# Patient Record
Sex: Female | Born: 1960
Health system: Southern US, Community
[De-identification: ages and names within clinical notes are randomized; demographics above are authoritative.]

## PROBLEM LIST (undated history)

## (undated) DIAGNOSIS — G4730 Sleep apnea, unspecified: Secondary | ICD-10-CM

## (undated) DIAGNOSIS — M1990 Unspecified osteoarthritis, unspecified site: Secondary | ICD-10-CM

## (undated) DIAGNOSIS — Z803 Family history of malignant neoplasm of breast: Secondary | ICD-10-CM

## (undated) HISTORY — PX: TUBAL LIGATION: SHX77

## (undated) HISTORY — DX: Family history of malignant neoplasm of breast: Z80.3

## (undated) HISTORY — PX: OTHER SURGICAL HISTORY: SHX169

---

## 1997-11-17 ENCOUNTER — Other Ambulatory Visit: Admission: RE | Admit: 1997-11-17 | Discharge: 1997-11-17 | Payer: Self-pay | Admitting: Obstetrics & Gynecology

## 1999-07-04 ENCOUNTER — Other Ambulatory Visit: Admission: RE | Admit: 1999-07-04 | Discharge: 1999-07-04 | Payer: Self-pay | Admitting: Obstetrics & Gynecology

## 2001-04-23 ENCOUNTER — Other Ambulatory Visit: Admission: RE | Admit: 2001-04-23 | Discharge: 2001-04-23 | Payer: Self-pay | Admitting: Obstetrics & Gynecology

## 2001-10-24 ENCOUNTER — Encounter: Payer: Self-pay | Admitting: Emergency Medicine

## 2001-10-24 ENCOUNTER — Emergency Department (HOSPITAL_COMMUNITY): Admission: EM | Admit: 2001-10-24 | Discharge: 2001-10-24 | Payer: Self-pay | Admitting: Emergency Medicine

## 2002-05-02 ENCOUNTER — Other Ambulatory Visit: Admission: RE | Admit: 2002-05-02 | Discharge: 2002-05-02 | Payer: Self-pay | Admitting: Obstetrics & Gynecology

## 2003-08-31 ENCOUNTER — Encounter: Admission: RE | Admit: 2003-08-31 | Discharge: 2003-08-31 | Payer: Self-pay | Admitting: Obstetrics & Gynecology

## 2003-09-29 ENCOUNTER — Other Ambulatory Visit: Admission: RE | Admit: 2003-09-29 | Discharge: 2003-09-29 | Payer: Self-pay | Admitting: Obstetrics & Gynecology

## 2004-06-08 HISTORY — PX: ABDOMINAL HYSTERECTOMY: SHX81

## 2004-11-18 ENCOUNTER — Ambulatory Visit: Payer: Self-pay | Admitting: Internal Medicine

## 2004-11-25 ENCOUNTER — Ambulatory Visit: Payer: Self-pay | Admitting: Internal Medicine

## 2005-01-02 ENCOUNTER — Ambulatory Visit: Payer: Self-pay | Admitting: Internal Medicine

## 2005-01-03 ENCOUNTER — Ambulatory Visit: Payer: Self-pay | Admitting: Family Medicine

## 2005-02-06 ENCOUNTER — Other Ambulatory Visit: Admission: RE | Admit: 2005-02-06 | Discharge: 2005-02-06 | Payer: Self-pay | Admitting: Obstetrics & Gynecology

## 2005-02-12 ENCOUNTER — Ambulatory Visit: Payer: Self-pay | Admitting: Internal Medicine

## 2005-02-14 ENCOUNTER — Encounter: Admission: RE | Admit: 2005-02-14 | Discharge: 2005-02-14 | Payer: Self-pay | Admitting: Obstetrics & Gynecology

## 2005-02-20 ENCOUNTER — Ambulatory Visit: Payer: Self-pay | Admitting: Internal Medicine

## 2005-04-04 ENCOUNTER — Ambulatory Visit: Payer: Self-pay | Admitting: Internal Medicine

## 2005-05-02 ENCOUNTER — Ambulatory Visit: Payer: Self-pay | Admitting: Internal Medicine

## 2005-06-23 ENCOUNTER — Ambulatory Visit (HOSPITAL_COMMUNITY): Admission: RE | Admit: 2005-06-23 | Discharge: 2005-06-23 | Payer: Self-pay | Admitting: Obstetrics & Gynecology

## 2005-07-17 ENCOUNTER — Inpatient Hospital Stay (HOSPITAL_COMMUNITY): Admission: RE | Admit: 2005-07-17 | Discharge: 2005-07-19 | Payer: Self-pay | Admitting: Obstetrics & Gynecology

## 2005-07-17 ENCOUNTER — Encounter (INDEPENDENT_AMBULATORY_CARE_PROVIDER_SITE_OTHER): Payer: Self-pay | Admitting: Specialist

## 2006-05-14 ENCOUNTER — Encounter: Admission: RE | Admit: 2006-05-14 | Discharge: 2006-05-14 | Payer: Self-pay | Admitting: Obstetrics & Gynecology

## 2008-12-07 LAB — CONVERTED CEMR LAB: Pap Smear: NORMAL

## 2009-02-16 ENCOUNTER — Ambulatory Visit: Payer: Self-pay | Admitting: Internal Medicine

## 2009-02-16 DIAGNOSIS — M949 Disorder of cartilage, unspecified: Secondary | ICD-10-CM

## 2009-02-16 DIAGNOSIS — Z780 Asymptomatic menopausal state: Secondary | ICD-10-CM | POA: Insufficient documentation

## 2009-02-16 DIAGNOSIS — M899 Disorder of bone, unspecified: Secondary | ICD-10-CM | POA: Insufficient documentation

## 2009-02-16 LAB — CONVERTED CEMR LAB
BUN: 13 mg/dL (ref 6–23)
Basophils Relative: 0.7 % (ref 0.0–3.0)
CO2: 31 meq/L (ref 19–32)
Cholesterol: 190 mg/dL (ref 0–200)
Direct LDL: 123.2 mg/dL
Eosinophils Relative: 1.4 % (ref 0.0–5.0)
HCT: 40.4 % (ref 36.0–46.0)
MCHC: 33.9 g/dL (ref 30.0–36.0)
MCV: 91.1 fL (ref 78.0–100.0)
Monocytes Absolute: 0.6 10*3/uL (ref 0.1–1.0)
Monocytes Relative: 8.1 % (ref 3.0–12.0)
Neutro Abs: 5.3 10*3/uL (ref 1.4–7.7)
RBC: 4.43 M/uL (ref 3.87–5.11)
Sodium: 142 meq/L (ref 135–145)

## 2009-08-02 ENCOUNTER — Encounter: Payer: Self-pay | Admitting: Internal Medicine

## 2010-07-26 NOTE — Discharge Summary (Signed)
Linda Mullins, Linda Mullins               ACCOUNT NO.:  [id number]   MEDICAL RECORD NO.:  [id number]          PATIENT TYPE:  INP   LOCATION:  9304                          FACILITY:  [facility]   PHYSICIAN:  Freddy Finner, M.D.   DATE OF BIRTH:  05-14-1960   DATE OF ADMISSION:  07/17/2005  DATE OF DISCHARGE:  07/19/2005                                 DISCHARGE SUMMARY   DISCHARGE DIAGNOSES:  1.  Uterine leiomyoma.  2.  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  3.  Cystourethrocele, with stress urinary incontinence.   OPERATIVE PROCEDURE:  1.  Total abdominal hysterectomy.  2.  Bilateral salpingo-oophorectomy.  3.  Marshall-Marchetti-Krantz bladder suspension.   INTRAOPERATIVE COMPLICATIONS:  None.   POSTOPERATIVE COMPLICATIONS:  None.   DISPOSITION:  The patient is voiding 250 mL with 100 mL of residual at the  time of her discharge.  For that reason, her catheter was removed.  Skin  staples were removed.  The patient was discharged home on a regular diet.  She is to resume any and all preoperative medications.  She is to use  Vivelle-Dot 0.1 mg for hormone replacement therapy.  She is given Percocet  to be taken 1-2 every 4 hours as needed for postoperative pain.  She is to  take ibuprofen along with this as needed.  She is to return to the office in  approximately two weeks.   Details of the present illness, past history, family history, review of  systems and physical exam are according to the admission note and/or the  operative summary.  Her physical findings on admission were primarily  remarkable for the pelvic findings with cystourethrocele with uterine  enlargement from fibroids and with a complex left adnexal mass on pelvic  ultrasound and CT scan.   Laboratory data during this admission includes a normal CBC on admission  with hemoglobin of 13.2.  Postoperatively, hemoglobin was 11.9, white count  15.7 on the first postoperative day.  The hemoglobin was 11.4 and  white  count 9.8 on the second postoperative day.   HOSPITAL COURSE:  The patient had the above-described procedure on the  morning of surgery.  She was treated perioperatively with antibiotic and  with PAS hose.  Over the next two days, she had adequate return of bladder  function.  She was afebrile throughout her post-op stay.  She was ambulating  without assistance.  She was tolerating a regular diet.  She was having  adequate bowel function.  She was discharged home with disposition as noted  above.      Freddy Finner, M.D.  Electronically Signed     WRN/MEDQ  D:  07/19/2005  T:  07/21/2005  Job:  [id number]

## 2010-07-26 NOTE — H&P (Signed)
NAMEARTEMISIA, Linda Mullins               ACCOUNT NO.:  [id number]   MEDICAL RECORD NO.:  [id number]          PATIENT TYPE:  AMB   LOCATION:  SDC                           FACILITY:  [facility]   PHYSICIAN:  Freddy Finner, M.D.   DATE OF BIRTH:  06-04-1960   DATE OF ADMISSION:  07/17/2005  DATE OF DISCHARGE:                                HISTORY & PHYSICAL   ADMISSION DIAGNOSES:  1.  Complex left ovarian cyst with cystic and solid components.  2.  Uterine leiomyomata.  3.  Menorrhagia.  4.  Recent increase in size of uterine leiomyomata.   The patient is a 50 year old white, married female, gravida 3, para 2, who  was seen for her regular annual examination in November 2006 at which time  it was felt that uterine leiomyomata first noted on examination in 1997 had  increased in size, and there was progressively increasing menorrhagia.  Her  exam was otherwise unremarkable for changes on her breast exam.  Because of  the menorrhagia, she was scheduled for sonohistogram which was actually not  accomplished in the office until April 12 of this year at which time  numerous leiomyomata were noted, but also present was a 3.1 x 3.5 cm cystic  mass with cystic and solid components including masses within the cyst  measuring 1.6 cm and 9 mm suggestive of dermoid or mucinous cyst adenoma.  Obviously the greatest concern at this point was the mass.  She subsequently  underwent further evaluation including serum CA-125 which was well within  the normal range.  She had a CT scan of the abdomen and pelvis which  confirmed uterine leiomyomata and the description of exophytic masses  arising from the interior aspect of the uterine fundus and also from the  anterior fundus.  In verbal consultation with Dr. Manson Passey, the radiologist, at  the [facility], she felt these findings were entirely consistent with  fibroids. She also noted the ovarian cyst and noted fat in the cyst.  No  other abnormalities were seen  on abdominal and pelvic CT scan.  The concern  about the cyst was discussed at great length with the patient on at least 2  different occasions.  Initially there was some concern about the presence of  malignancy which is not completely excluded, but given findings most  consistent with a dermoid cyst by CT and in combination with the ultrasound  and a negative CA-125 and otherwise normal pelvic and abdominal CT except  for fibroids, it was elected to proceed with total abdominal hysterectomy  and bilateral salpingo-oophorectomy.  This further addresses her additional  clinical issues with menorrhagia, and sonohistogram identified no  intracavitary abnormalities.   The patient had one additional complaint, and that is stress urinary  incontinence.  Preoperative evaluation and cystometric with a 3-channel  cystoscope in the office did in fact demonstrate stress urinary incontinence  with full bladder in standing position.  Other findings of note were a large  bladder capacity of approximately 750 mL __________ storage with 300 mL.  Her pelvic findings reveal minimal cystocele but significant  loss of  urethrovesical angle and increased mobility of the urethra.   Her current Review of Systems is otherwise negative.  There are no  cardiopulmonary, GI, or GU complaints.   PAST MEDICAL HISTORY:  The patient has no known significant medical  illnesses.  She has had mild depressive mood disorder for which she takes  Zoloft 50 to 100 mg a day.  Additional supplements which she takes are  Niacin, aspirin, and calcium.   The patient has been a relatively light cigarette smoker.  She uses alcohol  only socially.   She has no known allergies to medications.   Previous surgical procedures include therapeutic AB in 1985.  She had  laparoscopic sterilization in 1996.  At that time, she was noted to have  symmetrically, slightly enlarged uterus but no definite leiomyomata.  She  also has given  birth to 2 children vaginally.   FAMILY HISTORY:  Noncontributory.  There is a previous family history of  ovarian cancer.   PHYSICAL EXAMINATION:  HEENT:  Grossly within normal limits.  NECK:  Thyroid gland is not palpably enlarged.  VITAL SIGNS: Blood pressure in the office was 124/80.  CHEST: Clear to auscultation.  HEART: Normal sinus rhythm without murmurs, rubs, or gallops.  BREASTS:  Exam is remarkable for density in the upper outer quadrants  bilaterally.  (Mammogram in December 2006 did show question of masses, but  subsequent additional views of the breast revealed cystic changes only.)  ABDOMEN:  Soft and nontender without appreciable organomegaly or palpable  masses.  PELVIC:  External genitalia, vagina, and cervix are normal to inspection.  There is loss of urethrovesical angle with Valsalva noted above with  mobility of the urethra and at most first degree cystocele.  Posterior  support is good.  Uterus is palpably enlarged and irregularly nodular to  approximately 9 to [redacted] weeks gestation size.  The adnexal mass is not easily  palpable on clinical exam.  Rectovaginal exam confirms the above findings.  There is no palpable nodularity in the cul-de-sac or uterosacral ligaments.  Rectum is palpably normal.  EXTREMITIES:  Without cyanosis, clubbing, or edema.   ASSESSMENT:  1.  Complex left adnexal mass, most like benign cystic teratoma.  2.  Uterine leiomyomata, multiple, with secondary clinical symptoms of      menorrhagia.  3.  Stress urinary incontinence with cystometric findings consistent with      pretty much pure stress incontinence.   PLAN:  1.  Total abdominal hysterectomy.  2.  Bilateral salpingo-oophorectomy.  3.  Marshall-Marchetti-Krantz versus possible Burch suspension of bladder      and urethra.   All of the procedures have been discussed at length with the patient including a video which she viewed in the office describing hysterectomy.  The  potential risks of the procedure have been discussed.      Freddy Finner, M.D.  Electronically Signed     WRN/MEDQ  D:  07/16/2005  T:  07/16/2005  Job:  [id number]

## 2010-07-26 NOTE — Op Note (Signed)
Linda Mullins, Linda Mullins               ACCOUNT NO.:  [id number]   MEDICAL RECORD NO.:  [id number]          PATIENT TYPE:  INP   LOCATION:  9304                          FACILITY:  [facility]   PHYSICIAN:  Freddy Finner, M.D.   DATE OF BIRTH:  1960/09/28   DATE OF PROCEDURE:  07/17/2005  DATE OF DISCHARGE:                                 OPERATIVE REPORT   PREOPERATIVE DIAGNOSES:  1.  Uterine leiomyomata.  2.  Complex left ovarian mass.   POSTOPERATIVE DIAGNOSES:  1.  Mature cystic teratoma of left ovary.  2.  Uterine fibroids.   OPERATIVE PROCEDURE:  1.  Total abdominal hysterectomy.  2.  Bilateral salpingo-oophorectomy.   SECONDARY DIAGNOSIS:  Pure stress urinary incontinence by cystometric  evaluation.   SECONDARY PROCEDURE:  Marshall-Marchetti-Krantz suspension of bladder.   SURGEON:  Dr. Jennette Kettle   ASSISTANT:  Renaldo Fiddler   ESTIMATED INTRAOPERATIVE BLOOD LOSS:  300 mL.   INTRAOPERATIVE COMPLICATIONS:  None.   Details of the present illness recorded in the admission note.  The patient  was admitted on the morning of surgery.  She was given an IV antibiotic  preoperatively.  She was placed in antiembolic PAS hose.  She was brought to  the operating room, placed under adequate general anesthesia, placed in the  dorsal recumbent position with the Allen stirrup system to support the legs  and expose the vulva for the operative procedure.  The abdomen, perineum,  and vagina were prepped in the usual fashion with Betadine scrub followed by  Betadine solution.  A 30 mL bulbed Foley with a triple lumen was placed  without difficulty.  Sterile drapes were applied.  A lower abdominal  transverse incision was made approximately 3 cm above the symphysis.  The  incision was carried sharply down the fascia which was entered sharply and  extended to the extent of the skin incision.  Subcutaneous and fascial  vessels were controlled with a Bovie.  Rectus sheath was developed  superiorly and  inferiorly with blunt and sharp dissection.  Rectus muscles  were divided in the midline.  Peritoneum was elevated and entered sharply  and extended sharply to the extent of skin incision.  Peritoneal washings  were taken but later discarded because of the benign finding on frozen  section.  Aspiration of the upper abdomen was carried out with normal liver  edge, no palpable abnormality of gallbladder or kidneys.  The appendix was  visualized and was normal.  A self-retaining O'Connor-O'Sullivan retractor  was placed.  Moist packs were used to pack the distal contents out of the  pelvis.  The uterus was irregularly nodular and enlarged to approximately 10  weeks' gestational size.  The left ovary was enlarged to approximately 5 cm,  was smooth externally and free of any adhesions.  The right fallopian tube  and ovary were normal.  The left fallopian tube was normal.  The patient had  had previous tubal ligation.  There was isthmic portion of the tube missing  on either side.  The proximal broad ligaments were grasped with Kellys.  The  round  ligaments on each side were taken with a suture ligature of 0 Monocryl  and divided.  The ovary, in fact, was excised and sent before beginning the  hysterectomy portion of the procedure.  The left infundibulopelvic ligament  was divided and doubly ligated with free tie of 0 Monocryl followed by  suture ligature of 0 Monocryl.  The right infundibulopelvic ligament was  developed in a similar fashion.  The upper broad ligament was then developed  using the gyrus tripolar Heaney-style clamp.  The bladder was released with  a transverse incision in the peritoneum overlying the lower uterine segment  and cervix.  Bladder was very carefully dissected with blunt and sharp  dissection off the cervix using the gyrus device.  The uteroovarian pedicles  were sealed and divided.  The cardinal ligament pedicles were then sealed  and divided.  The uterosacrals were  taken with curved Heaneys, divided  sharply, and ligated with 0 Monocryl in a Heaney fashion.  Angles of the  vagina were then entered using a Heaney and the cervix completely excised  from the vaginal cuff.  The uterosacral pedicle actually included the angle  on the left.  On the right, the angle was secured and anchored to the  uterosacral pedicle.  Cuff was closed from anterior to posterior.  Small  bleeding sources along the cuff were controlled with the Bovie, and a single  interrupted suture was placed anteriorly on the cuff for complete  hemostasis.  Irrigation was carried out.  Small bleeding sources were  controlled with Bovie along the left pelvic sidewall.  Hemostasis was noted  then to be complete.  All pack, needle, and instruments were removed.  The  peritoneum was then closed with a running 0 Monocryl suture.  The space of  Retzius was carefully dissected with blunt dissection.  A 0 Dexon suture on  a UR-6 needle was then used and with digital palpation vaginally, the  urethrovesical angle was identified while traction was applied to the Foley  bulb.  A suture was placed approximately 1 cm lateral to the urethra and 1  cm inferior to the bladder on the right side and anchored to the proximal  portion of Cooper's ligament.  On the left side, the suture was placed  similarly with the same technique and the suture anchored to the periosteum  of the symphysis.  Sutures were tied which adequately elevated the  urethrovesical angle.  Bladder was then filled with irrigating solution and  a Bonanno catheter placed and anchored to the skin with nylon sutures.  Irrigation was carried out.  Fascia was then closed with a running 0 PDS  running from angle to midline on either side.  Skin was closed with wide  skin staples and quarter-inch Steri-Strips.  The patient tolerated the  operative procedure well.  She was awakened and taken to the recovery room  in good condition.      Freddy Finner, M.D.  Electronically Signed     WRN/MEDQ  D:  07/17/2005  T:  07/17/2005  Job:  [id number]

## 2010-10-17 ENCOUNTER — Other Ambulatory Visit: Payer: Self-pay | Admitting: Obstetrics & Gynecology

## 2010-10-17 DIAGNOSIS — R928 Other abnormal and inconclusive findings on diagnostic imaging of breast: Secondary | ICD-10-CM

## 2010-10-25 ENCOUNTER — Ambulatory Visit
Admission: RE | Admit: 2010-10-25 | Discharge: 2010-10-25 | Disposition: A | Discharge status: Home / Self Care | Payer: Self-pay | Source: Ambulatory Visit | Attending: Obstetrics & Gynecology | Admitting: Obstetrics & Gynecology

## 2010-10-25 ENCOUNTER — Other Ambulatory Visit: Payer: Self-pay | Admitting: Obstetrics & Gynecology

## 2010-10-25 DIAGNOSIS — R928 Other abnormal and inconclusive findings on diagnostic imaging of breast: Secondary | ICD-10-CM

## 2010-10-25 DIAGNOSIS — R921 Mammographic calcification found on diagnostic imaging of breast: Secondary | ICD-10-CM

## 2010-11-01 ENCOUNTER — Ambulatory Visit
Admission: RE | Admit: 2010-11-01 | Discharge: 2010-11-01 | Disposition: A | Discharge status: Home / Self Care | Payer: Self-pay | Source: Ambulatory Visit | Attending: Obstetrics & Gynecology | Admitting: Obstetrics & Gynecology

## 2010-11-01 ENCOUNTER — Other Ambulatory Visit: Payer: Self-pay | Admitting: Diagnostic Radiology

## 2010-11-01 DIAGNOSIS — R921 Mammographic calcification found on diagnostic imaging of breast: Secondary | ICD-10-CM

## 2010-11-14 ENCOUNTER — Encounter (INDEPENDENT_AMBULATORY_CARE_PROVIDER_SITE_OTHER): Payer: Self-pay | Admitting: Surgery

## 2010-11-14 ENCOUNTER — Ambulatory Visit (INDEPENDENT_AMBULATORY_CARE_PROVIDER_SITE_OTHER): Payer: Self-pay | Admitting: Surgery

## 2010-11-14 VITALS — BP 122/82 | HR 68 | Temp 96.8°F | Ht 67.0 in | Wt 164.0 lb

## 2010-11-14 DIAGNOSIS — N6029 Fibroadenosis of unspecified breast: Secondary | ICD-10-CM

## 2010-11-14 NOTE — Progress Notes (Signed)
ASSESSMENT AND PLAN: 1.  Radial sclerosing lesion, Left UOQ  Despite benign report, would recommend excision of this area.  Risks of surgery include, but are not limited to, bleeding, infection, nerve injury and need for further excision.  I gave her a copy of her path report and literature on breast surgery.  Chief Complaint  Patient presents with  . Other    Eval of left breast radial sclerosing lesion - Dr. Fredrich Romans [phone]/[employer] [facility]    HISTORY OF PRESENT ILLNESS: Linda Mullins is a 50 y.o. (DOB: 1961-02-08)  white female who is a patient of Carrie Mew, MD and comes to me today for left breast lesion.  She sees Dr. Renato Shin from a GYN standpoint.  The patient's mother two months ago underwent a biopsy for a sclerosing lesion.  Ms. Fite gets annual mammograms and has never had any prior biopsy.  She had a mammogram 10/25/2010 which showed a suspicious area in the left breast.  A core biopsy 11/01/2010 showed a radial sclerosing lesion. She had a BSO-TAH in 2005 for benign disease.  She is on the hormone patch.  She has an aunt on her father's side who had breast cancer.  She has had some cyst in her right breast before, but these were never biopsied or aspirate.  Past Medical History  Diagnosis Date  . Family history of breast cancer     aunt on father's side    Past Surgical History  Procedure Date  . Abdominal hysterectomy 06/2004    Current Outpatient Prescriptions  Medication Sig Dispense Refill  . calcium carbonate (OS-CAL) 600 MG TABS Take 600 mg by mouth daily.        . Cholecalciferol (VITAMIN D) 2000 UNITS CAPS Take by mouth daily.        Marland Kitchen estradiol (VIVELLE-DOT) 0.1 MG/24HR Place 1 patch onto the skin 2 (two) times a week.        . Multiple Vitamin (MULTIVITAMIN) capsule Take 1 capsule by mouth daily.         REVIEW OF SYSTEMS: Skin:  No history of rash.  No history of abnormal moles. Infection: .  No history of MRSA. Neurologic:  No history of  stroke.  No history of seizure.  No history of headaches. Cardiac:  No history of hypertension. No history of heart disease.  No history of prior cardiac catheterization.  No history of seeing a cardiologist. Pulmonary:  Does not smoke cigarettes.  No asthma or bronchitis.  No OSA/CPAP.  Endocrine:  No diabetes. No thyroid disease. Gastrointestinal:  No history of stomach disease.  No history of liver disease.  No history of gall bladder disease.  No history of pancreas disease.  No history of colon disease. Urologic:  No history of kidney stones.  No history of bladder infections. Musculoskeletal:  No history of joint or back disease. Hematologic:  No bleeding disorder.  No history of anemia.  Not anticoagulated.  No Known Allergies  SOCIAL and FAMILY HISTORY: Married.  Works as a Sales executive.  Knows [place] who go to her dentist.  PHYSICAL EXAM: BP 122/82  Pulse 68  Temp(Src) 96.8 F (36 C) (Temporal)  Ht 5\' 7"  (1.702 m)  Wt 164 lb (74.39 kg)  BMI 25.69 kg/m2  HEENT: Normal. Pupils equal. Normal dentition. Neck: Supple. No thyroid mass. Lymph Nodes:  No supraclavicular or cervical nodes.  No axillary adenopathy. Lungs: Clear and symmetric. Heart:  RRR. No murmur.  Breasts:  Left breast  with nodule at biopsy site (3 o'clock).  This came up after the biopsy.  Right breast unremarkable. Abdomen: No mass. No tenderness. No hernia. Normal bowel sounds.  No abdominal scars. Rectal: Not done. Extremities:  Good strength in upper and lower extremities. Neurologic:  Grossly intact to motor and sensory function.  DATA REVIEWED: Path report - 11/01/2010. Discussed with Dr. Irine Seal about options.

## 2010-11-26 ENCOUNTER — Other Ambulatory Visit (INDEPENDENT_AMBULATORY_CARE_PROVIDER_SITE_OTHER): Payer: Self-pay | Admitting: Surgery

## 2010-11-26 DIAGNOSIS — N649 Disorder of breast, unspecified: Secondary | ICD-10-CM

## 2011-01-22 ENCOUNTER — Encounter (HOSPITAL_BASED_OUTPATIENT_CLINIC_OR_DEPARTMENT_OTHER): Payer: Self-pay | Admitting: *Deleted

## 2011-01-24 ENCOUNTER — Ambulatory Visit (HOSPITAL_BASED_OUTPATIENT_CLINIC_OR_DEPARTMENT_OTHER): Payer: PRIVATE HEALTH INSURANCE | Admitting: Anesthesiology

## 2011-01-24 ENCOUNTER — Encounter (HOSPITAL_BASED_OUTPATIENT_CLINIC_OR_DEPARTMENT_OTHER): Payer: Self-pay | Admitting: Surgery

## 2011-01-24 ENCOUNTER — Other Ambulatory Visit: Payer: Self-pay

## 2011-01-24 ENCOUNTER — Encounter (HOSPITAL_BASED_OUTPATIENT_CLINIC_OR_DEPARTMENT_OTHER): Payer: Self-pay | Admitting: *Deleted

## 2011-01-24 ENCOUNTER — Encounter (HOSPITAL_BASED_OUTPATIENT_CLINIC_OR_DEPARTMENT_OTHER): Payer: Self-pay | Admitting: Anesthesiology

## 2011-01-24 ENCOUNTER — Ambulatory Visit (HOSPITAL_BASED_OUTPATIENT_CLINIC_OR_DEPARTMENT_OTHER)
Admission: RE | Admit: 2011-01-24 | Discharge: 2011-01-24 | Disposition: A | Discharge status: Home / Self Care | Payer: PRIVATE HEALTH INSURANCE | Source: Ambulatory Visit | Attending: Surgery | Admitting: Surgery

## 2011-01-24 ENCOUNTER — Ambulatory Visit
Admission: RE | Admit: 2011-01-24 | Discharge: 2011-01-24 | Disposition: A | Discharge status: Home / Self Care | Payer: Self-pay | Source: Ambulatory Visit | Attending: Surgery | Admitting: Surgery

## 2011-01-24 ENCOUNTER — Other Ambulatory Visit (INDEPENDENT_AMBULATORY_CARE_PROVIDER_SITE_OTHER): Payer: Self-pay | Admitting: Surgery

## 2011-01-24 ENCOUNTER — Encounter (HOSPITAL_BASED_OUTPATIENT_CLINIC_OR_DEPARTMENT_OTHER)
Admission: RE | Disposition: A | Discharge status: Home / Self Care | Payer: Self-pay | Source: Ambulatory Visit | Attending: Surgery

## 2011-01-24 DIAGNOSIS — D249 Benign neoplasm of unspecified breast: Secondary | ICD-10-CM

## 2011-01-24 DIAGNOSIS — N6029 Fibroadenosis of unspecified breast: Secondary | ICD-10-CM | POA: Insufficient documentation

## 2011-01-24 DIAGNOSIS — Z01812 Encounter for preprocedural laboratory examination: Secondary | ICD-10-CM | POA: Insufficient documentation

## 2011-01-24 DIAGNOSIS — N649 Disorder of breast, unspecified: Secondary | ICD-10-CM

## 2011-01-24 HISTORY — DX: Unspecified osteoarthritis, unspecified site: M19.90

## 2011-01-24 HISTORY — PX: BREAST BIOPSY: SHX20

## 2011-01-24 SURGERY — BREAST BIOPSY
Anesthesia: General | Laterality: Left

## 2011-01-24 SURGERY — BREAST BIOPSY WITH NEEDLE LOCALIZATION
Anesthesia: General | Site: Breast | Laterality: Left | Wound class: Clean

## 2011-01-24 MED ORDER — HYDROCODONE-ACETAMINOPHEN 5-325 MG PO TABS: 1.0000 | ORAL_TABLET | Freq: Four times a day (QID) | ORAL | Status: AC | PRN

## 2011-01-24 MED ORDER — FENTANYL CITRATE 0.05 MG/ML IJ SOLN
INTRAMUSCULAR | Status: DC | PRN
Start: 2011-01-24 — End: 2011-01-24
  Administered 2011-01-24: 100 ug via INTRAVENOUS
  Administered 2011-01-24 (×2): 50 ug via INTRAVENOUS

## 2011-01-24 MED ORDER — MIDAZOLAM HCL 5 MG/5ML IJ SOLN
INTRAMUSCULAR | Status: DC | PRN
  Administered 2011-01-24: 2 mg via INTRAVENOUS

## 2011-01-24 MED ORDER — LIDOCAINE HCL (CARDIAC) 20 MG/ML IV SOLN
INTRAVENOUS | Status: DC | PRN
  Administered 2011-01-24: 40 mg via INTRAVENOUS

## 2011-01-24 MED ORDER — MEPERIDINE HCL 25 MG/ML IJ SOLN: 6.2500 mg | INTRAMUSCULAR | Status: DC | PRN

## 2011-01-24 MED ORDER — BUPIVACAINE HCL (PF) 0.25 % IJ SOLN
INTRAMUSCULAR | Status: DC | PRN
  Administered 2011-01-24: 30 mL

## 2011-01-24 MED ORDER — ONDANSETRON HCL 4 MG/2ML IJ SOLN: 4.0000 mg | Freq: Once | INTRAMUSCULAR | Status: DC | PRN

## 2011-01-24 MED ORDER — PROPOFOL 10 MG/ML IV EMUL
INTRAVENOUS | Status: DC | PRN
  Administered 2011-01-24: 150 mg via INTRAVENOUS
  Administered 2011-01-24: 20 mg via INTRAVENOUS

## 2011-01-24 MED ORDER — LACTATED RINGERS IV SOLN
INTRAVENOUS | Status: DC
  Administered 2011-01-24: 20 mL/h via INTRAVENOUS
  Administered 2011-01-24: 14:00:00 via INTRAVENOUS

## 2011-01-24 MED ORDER — ONDANSETRON HCL 4 MG/2ML IJ SOLN
INTRAMUSCULAR | Status: DC | PRN
  Administered 2011-01-24: 4 mg via INTRAVENOUS

## 2011-01-24 MED ORDER — DEXAMETHASONE SODIUM PHOSPHATE 4 MG/ML IJ SOLN
INTRAMUSCULAR | Status: DC | PRN
  Administered 2011-01-24: 10 mg via INTRAVENOUS

## 2011-01-24 MED ORDER — HYDROMORPHONE HCL PF 1 MG/ML IJ SOLN
0.2500 mg | INTRAMUSCULAR | Status: DC | PRN
  Administered 2011-01-24 (×2): 0.5 mg via INTRAVENOUS

## 2011-01-24 SURGICAL SUPPLY — 45 items
BANDAGE ELASTIC 6 VELCRO ST LF (GAUZE/BANDAGES/DRESSINGS) IMPLANT
BENZOIN TINCTURE PRP APPL 2/3 (GAUZE/BANDAGES/DRESSINGS) IMPLANT
BINDER BREAST LRG (GAUZE/BANDAGES/DRESSINGS) IMPLANT
BINDER BREAST MEDIUM (GAUZE/BANDAGES/DRESSINGS) IMPLANT
BINDER BREAST XLRG (GAUZE/BANDAGES/DRESSINGS) ×2 IMPLANT
BINDER BREAST XXLRG (GAUZE/BANDAGES/DRESSINGS) IMPLANT
BLADE SURG 10 STRL SS (BLADE) ×2 IMPLANT
BLADE SURG 15 STRL LF DISP TIS (BLADE) IMPLANT
BLADE SURG 15 STRL SS (BLADE)
CANISTER SUCTION 1200CC (MISCELLANEOUS) ×2 IMPLANT
CHLORAPREP W/TINT 26ML (MISCELLANEOUS) ×2 IMPLANT
CLIP TI WIDE RED SMALL 6 (CLIP) IMPLANT
CLOTH BEACON ORANGE TIMEOUT ST (SAFETY) ×2 IMPLANT
COVER MAYO STAND STRL (DRAPES) ×2 IMPLANT
COVER TABLE BACK 60X90 (DRAPES) ×2 IMPLANT
DECANTER SPIKE VIAL GLASS SM (MISCELLANEOUS) IMPLANT
DERMABOND ADVANCED (GAUZE/BANDAGES/DRESSINGS) ×1
DERMABOND ADVANCED .7 DNX12 (GAUZE/BANDAGES/DRESSINGS) ×1 IMPLANT
DEVICE DUBIN W/COMP PLATE 8390 (MISCELLANEOUS) ×2 IMPLANT
DRAPE PED LAPAROTOMY (DRAPES) ×2 IMPLANT
DRAPE UTILITY XL STRL (DRAPES) ×2 IMPLANT
ELECT COATED BLADE 2.86 ST (ELECTRODE) ×2 IMPLANT
ELECT REM PT RETURN 9FT ADLT (ELECTROSURGICAL) ×2
ELECTRODE REM PT RTRN 9FT ADLT (ELECTROSURGICAL) ×1 IMPLANT
GAUZE SPONGE 4X4 12PLY STRL LF (GAUZE/BANDAGES/DRESSINGS) IMPLANT
GAUZE SPONGE 4X4 16PLY XRAY LF (GAUZE/BANDAGES/DRESSINGS) IMPLANT
GLOVE SURG SIGNA 7.5 PF LTX (GLOVE) ×4 IMPLANT
GOWN PREVENTION PLUS XLARGE (GOWN DISPOSABLE) ×4 IMPLANT
KIT MARKER MARGIN INK (KITS) ×2 IMPLANT
NDL SAFETY ECLIPSE 18X1.5 (NEEDLE) IMPLANT
NEEDLE HYPO 18GX1.5 SHARP (NEEDLE)
NEEDLE HYPO 25X1 1.5 SAFETY (NEEDLE) ×2 IMPLANT
NS IRRIG 1000ML POUR BTL (IV SOLUTION) ×2 IMPLANT
PACK BASIN DAY SURGERY FS (CUSTOM PROCEDURE TRAY) ×2 IMPLANT
PENCIL BUTTON HOLSTER BLD 10FT (ELECTRODE) ×2 IMPLANT
SLEEVE SCD COMPRESS KNEE MED (MISCELLANEOUS) ×2 IMPLANT
SPONGE LAP 4X18 X RAY DECT (DISPOSABLE) ×4 IMPLANT
STRIP CLOSURE SKIN 1/4X4 (GAUZE/BANDAGES/DRESSINGS) IMPLANT
SUT VIC AB 5-0 PS2 18 (SUTURE) ×2 IMPLANT
SUT VICRYL 3-0 CR8 SH (SUTURE) ×2 IMPLANT
SYR CONTROL 10ML LL (SYRINGE) ×2 IMPLANT
TOWEL OR NON WOVEN STRL DISP B (DISPOSABLE) ×2 IMPLANT
TUBE CONNECTING 20X1/4 (TUBING) ×2 IMPLANT
WATER STERILE IRR 1000ML POUR (IV SOLUTION) IMPLANT
YANKAUER SUCT BULB TIP NO VENT (SUCTIONS) ×2 IMPLANT

## 2011-01-24 NOTE — Transfer of Care (Signed)
Immediate Anesthesia Transfer of Care Note  Patient: Linda Mullins  Procedure(s) Performed:  BREAST BIOPSY WITH NEEDLE LOCALIZATION - LEFT BREAST NEEDLE LOCILIZED BIOPSY BREAST   Patient Location: PACU  Anesthesia Type: General  Level of Consciousness: awake, oriented and patient cooperative  Airway & Oxygen Therapy: Patient Spontanous Breathing and Patient connected to face mask oxygen  Post-op Assessment: Report given to PACU RN, Post -op Vital signs reviewed and stable and Patient moving all extremities  Post vital signs: Reviewed and stable  Complications: No apparent anesthesia complications

## 2011-01-24 NOTE — Anesthesia Postprocedure Evaluation (Signed)
  Anesthesia Post-op Note  Patient: Linda Mullins  Procedure(s) Performed:  BREAST BIOPSY WITH NEEDLE LOCALIZATION - LEFT BREAST NEEDLE LOCILIZED BIOPSY BREAST   Patient Location: PACU  Anesthesia Type: General  Level of Consciousness: awake  Airway and Oxygen Therapy: Patient Spontanous Breathing  Post-op Pain: none  Post-op Assessment: Post-op Vital signs reviewed  Post-op Vital Signs: stable  Complications: No apparent anesthesia complications

## 2011-01-24 NOTE — Op Note (Signed)
NAME:  DANISSA, RUNDLE                    ACCOUNT NO.:  MEDICAL RECORD NO.:  [id number]  LOCATION:                                 FACILITY:  PHYSICIAN:  Sandria Bales. Ezzard Standing, M.D.  DATE OF BIRTH:  1960-05-07  DATE OF PROCEDURE:  01/24/2011                              OPERATIVE REPORT  PREOPERATIVE DIAGNOSIS:  Radial sclerosing lesion, left breast at 3 o'clock position.  POSTOPERATIVE DIAGNOSIS:  Radial sclerosing lesion, left breast at 3 o'clock position.  PROCEDURE:  Left breast biopsy with needle localization.  SURGEON:  Sandria Bales. Ezzard Standing, MD  FIRST ASSISTANT:  None.  ANESTHESIA:  General endotracheal with approximately 30 mL of 0.25% Marcaine.  COMPLICATIONS:  None.  INDICATION FOR PROCEDURE:  Ms. Kepple is a 50 year old white female, patient of Dr. Darryll Capers who had an abnormal mammogram and underwent a core biopsy of her left brest, which showed a sclerosing lesion.  It was felt best that the patient have an excisional biopsy of this area to make sure there is no suspicious changes.  The indication and potential complication of breast biopsy were also explained to the patient.  Potential complications of the breast biopsy include, but are limited to, bleeding, infection, nerve injury, and the need for further surgery.  OPERATIVE NOTE:  The patient had a wire placed in her left breast coming at the 3 o'clock position, and angled a bit cranial.  She was taken to operating room 3 where she underwent a general endotracheal anesthetic. Her left breast was prepped with ChloraPrep and sterilely draped.    A time-out was held and surgical checklist was run.  I made an elliptical incision around the wire, which was about 3.5 to 4 cm in length.  I excised a core of breast tissue about 5 x 4 cm.  I did a specimen mammogram in the room, which confirmed the clip and the wire in the middle of the specimen.  The wound was then irrigated with saline, infiltrated with about  0.25% Marcaine plain.  The area was excised, specimen mammogram was shot.  I did paint the specimen oriented prior to the specimen mammogram with a 6 paint kit.  I again confirmed the clip and the wire in the middle of the specimen and sent to Pathology.  The wound was then irrigated with saline, infiltrated pressure wound with 30 mL of 0.25% Marcaine, closed with 3-0 Vicryl sutures in deep superficial layer and 5-0 Vicryl suture in the skin.  Wound was painted with Dermabond and sterilely dressed and a pressure dressing placed on it.  She tolerated the procedure well, was transported to the recovery room in good condition.   Sandria Bales. Ezzard Standing, M.D., FACS  DHN/MEDQ  D:  01/24/2011  T:  01/24/2011  Job:  [id number]  cc:   Stacie Glaze, MD Varney Baas, MD

## 2011-01-24 NOTE — Anesthesia Procedure Notes (Signed)
Procedure Name: LMA Insertion Date/Time: 01/24/2011 1:29 PM Performed by: Meyer Russel Pre-anesthesia Checklist: Patient identified, Emergency Drugs available, Suction available, Patient being monitored and Timeout performed Patient Re-evaluated:Patient Re-evaluated prior to inductionOxygen Delivery Method: Circle System Utilized Preoxygenation: Pre-oxygenation with 100% oxygen Intubation Type: IV induction Ventilation: Mask ventilation without difficulty LMA: LMA flexible inserted LMA Size: 4.0 Number of attempts: 1 Airway Equipment and Method: bite block Placement Confirmation: positive ETCO2 and breath sounds checked- equal and bilateral Tube secured with: Tape Dental Injury: Teeth and Oropharynx as per pre-operative assessment

## 2011-01-24 NOTE — Brief Op Note (Signed)
01/24/2011  2:23 PM  PATIENT:  Linda Mullins, 50 y.o., female, MRN: [id number]  PREOP DIAGNOSIS:  Sclerosing Left Breast Lesion  POSTOP DIAGNOSIS:   Radial sclerosing lesion,  Left breast at 3 o'clock  PROCEDURE:   Procedure(s): BREAST BIOPSY WITH NEEDLE LOCALIZATION  SURGEON:   Ovidio Kin, M.D.  ASSISTANT:   none  ANESTHESIA:   general  EBL:  min  Total I/O In: 1500 [I.V.:1500] Out: -   BLOOD ADMINISTERED: none  DRAINS: none   LOCAL MEDICATIONS USED:   30 cc 1/4% marcaine  SPECIMEN:   Breast biopsy  COUNTS CORRECT:  YES  INDICATIONS FOR PROCEDURE:  Linda Mullins is a 50 y.o. (DOB: Dec 12, 1960) white female whose primary care physician is Carrie Mew, MD and comes for left breast biopsy for sclerosing lesion.   The indications and risks of the surgery were explained to the patient.  The risks include, but are not limited to, infection, bleeding, and nerve injury.  Note dictated to:   [id number]  01/24/2011  Dn

## 2011-01-24 NOTE — H&P (Signed)
ASSESSMENT AND PLAN:  1. Radial sclerosing lesion, Left UOQ   Despite benign report, would recommend excision of this area.   Risks of surgery include, but are not limited to, bleeding, infection, nerve injury and need for further excision. I gave her a copy of her path report and literature on breast surgery.   Husband at bedside, no new information or questions.  For left breast biopsy 01/24/2011.  Chief Complaint   Patient presents with   .  Other     Eval of left breast radial sclerosing lesion - Dr. Fredrich Romans [phone]/[facility]    HISTORY OF PRESENT ILLNESS:  Linda Mullins is a 50 y.o. (DOB: 1961-01-04) white female who is a patient of Carrie Mew, MD and comes to me today for left breast lesion. She sees Dr. Renato Shin from a GYN standpoint.  The patient's mother two months ago underwent a biopsy for a sclerosing lesion. Ms. Hinesley gets annual mammograms and has never had any prior biopsy. She had a mammogram 10/25/2010 which showed a suspicious area in the left breast. A core biopsy 11/01/2010 showed a radial sclerosing lesion. She had a BSO-TAH in 2005 for benign disease. She is on the hormone patch. She has an aunt on her father's side who had breast cancer. She has had some cyst in her right breast before, but these were never biopsied or aspirate.   Past Medical History   Diagnosis  Date   .  Family history of breast cancer      aunt on father's side    Past Surgical History   Procedure  Date   .  Abdominal hysterectomy  06/2004    Current Outpatient Prescriptions   Medication  Sig  Dispense  Refill   .  calcium carbonate (OS-CAL) 600 MG TABS  Take 600 mg by mouth daily.     .  Cholecalciferol (VITAMIN D) 2000 UNITS CAPS  Take by mouth daily.     Marland Kitchen  estradiol (VIVELLE-DOT) 0.1 MG/24HR  Place 1 patch onto the skin 2 (two) times a week.     .   Multiple Vitamin (MULTIVITAMIN) capsule  Take 1 capsule by mouth daily.      REVIEW OF SYSTEMS:  Skin: No  history of rash. No history of abnormal moles.  Infection: . No history of MRSA.  Neurologic: No history of stroke. No history of seizure. No history of headaches.  Cardiac: No history of hypertension. No history of heart disease. No history of prior cardiac catheterization. No history of seeing a cardiologist.  Pulmonary: Does not smoke cigarettes. No asthma or bronchitis. No OSA/CPAP.  Endocrine: No diabetes. No thyroid disease.  Gastrointestinal: No history of stomach disease. No history of liver disease. No history of gall bladder disease. No history of pancreas disease. No history of colon disease.  Urologic: No history of kidney stones. No history of bladder infections.  Musculoskeletal: No history of joint or back disease.  Hematologic: No bleeding disorder. No history of anemia. Not anticoagulated.  No Known Allergies  SOCIAL and FAMILY HISTORY:  Married. Works as a Sales executive. Knows [place] who go to her dentist.   PHYSICAL EXAM:   HEENT: Normal. Pupils equal. Normal dentition.  Neck: Supple. No thyroid mass.  Lymph Nodes: No supraclavicular or cervical nodes. No axillary adenopathy.  Lungs: Clear and symmetric.  Heart: RRR. No murmur.  Breasts: Left breast with nodule at biopsy site (3 o'clock). This came up after the biopsy. Right breast  unremarkable.   Now has wire in left breast. Abdomen: No mass. No tenderness. No hernia. Normal bowel sounds. No abdominal scars.  Rectal: Not done.  Extremities: Good strength in upper and lower extremities.  Neurologic: Grossly intact to motor and sensory function.   DATA REVIEWED:  Path report - 11/01/2010.  Discussed with Dr. Irine Seal about options.

## 2011-01-24 NOTE — Anesthesia Preprocedure Evaluation (Addendum)
Anesthesia Evaluation  Patient identified by MRN, date of birth, ID band Patient awake    Reviewed: Allergy & Precautions, H&P , NPO status , Patient's Chart, lab work & pertinent test results  Airway       Dental   Pulmonary          Cardiovascular     Neuro/Psych    GI/Hepatic   Endo/Other    Renal/GU      Musculoskeletal   Abdominal   Peds  Hematology   Anesthesia Other Findings   Reproductive/Obstetrics                           Anesthesia Physical Anesthesia Plan  ASA: II  Anesthesia Plan: General   Post-op Pain Management:    Induction:   Airway Management Planned:   Additional Equipment:   Intra-op Plan:   Post-operative Plan:   Informed Consent: I have reviewed the patients History and Physical, chart, labs and discussed the procedure including the risks, benefits and alternatives for the proposed anesthesia with the patient or authorized representative who has indicated his/her understanding and acceptance.   Dental Advisory Given  Plan Discussed with: CRNA and Surgeon  Anesthesia Plan Comments:        Anesthesia Quick Evaluation  

## 2011-01-28 ENCOUNTER — Encounter (HOSPITAL_BASED_OUTPATIENT_CLINIC_OR_DEPARTMENT_OTHER): Payer: Self-pay | Admitting: Anesthesiology

## 2011-01-28 ENCOUNTER — Encounter (HOSPITAL_BASED_OUTPATIENT_CLINIC_OR_DEPARTMENT_OTHER): Payer: Self-pay | Admitting: Surgery

## 2011-02-13 ENCOUNTER — Ambulatory Visit (INDEPENDENT_AMBULATORY_CARE_PROVIDER_SITE_OTHER): Payer: PRIVATE HEALTH INSURANCE | Admitting: Surgery

## 2011-02-13 ENCOUNTER — Encounter (INDEPENDENT_AMBULATORY_CARE_PROVIDER_SITE_OTHER): Payer: Self-pay | Admitting: Surgery

## 2011-02-13 VITALS — BP 124/76 | HR 64 | Temp 98.0°F | Resp 12 | Ht 67.0 in | Wt 165.8 lb

## 2011-02-13 DIAGNOSIS — N6320 Unspecified lump in the left breast, unspecified quadrant: Secondary | ICD-10-CM | POA: Insufficient documentation

## 2011-02-13 DIAGNOSIS — N63 Unspecified lump in unspecified breast: Secondary | ICD-10-CM

## 2011-02-13 NOTE — Progress Notes (Signed)
[facility]  Ovidio Kin, MD,  FACS [address] [place]  [address] [place], [place]    [address] Phone:  [phone] FAX:  [phone]   Re:   Linda Mullins DOB:   03-14-1960 MRN:   [id number]  ASSESSMENT AND PLAN: 1.  Sclerosing lesion, left breast  Post op follow up.   Encouraged annual mammograms.  Annual physical exams.  Monthly self exams.    HISTORY OF PRESENT ILLNESS:  Linda Mullins is a 50 y.o. (DOB: [birth year])  white female who is a patient of Carrie Mew, MD and comes to me today for post op left breast bx which was done on 01/24/2011. She sees Dr. Renato Shin from a GYN standpoint and we talked about estrogen replacement therapy.  PHYSICAL EXAM: There were no vitals taken for this visit.  Breast:  Left breast incision looks good.  DATA REVIEWED: Path report to patient.   Ovidio Kin, MD, FACS Office:  [phone]

## 2011-02-13 NOTE — Patient Instructions (Signed)
1.  Annual mammograms.  Annual physical exams.  Monthly self exams.

## 2011-08-29 ENCOUNTER — Other Ambulatory Visit (INDEPENDENT_AMBULATORY_CARE_PROVIDER_SITE_OTHER): Payer: PRIVATE HEALTH INSURANCE

## 2011-08-29 DIAGNOSIS — Z0000 Encounter for general adult medical examination without abnormal findings: Secondary | ICD-10-CM

## 2011-08-29 LAB — HEPATIC FUNCTION PANEL
ALT: 14 U/L (ref 0–35)
AST: 18 U/L (ref 0–37)
Albumin: 4.1 g/dL (ref 3.5–5.2)
Alkaline Phosphatase: 79 U/L (ref 39–117)
Total Bilirubin: 0.5 mg/dL (ref 0.3–1.2)

## 2011-08-29 LAB — POCT URINALYSIS DIPSTICK
Ketones, UA: NEGATIVE
Leukocytes, UA: NEGATIVE
Nitrite, UA: NEGATIVE
Protein, UA: NEGATIVE
Urobilinogen, UA: 0.2
pH, UA: 7

## 2011-08-29 LAB — CBC WITH DIFFERENTIAL/PLATELET
Basophils Absolute: 0 10*3/uL (ref 0.0–0.1)
Eosinophils Relative: 1.7 % (ref 0.0–5.0)
HCT: 41.9 % (ref 36.0–46.0)
Hemoglobin: 13.6 g/dL (ref 12.0–15.0)
Lymphocytes Relative: 21 % (ref 12.0–46.0)
Lymphs Abs: 1.1 10*3/uL (ref 0.7–4.0)
Monocytes Relative: 8 % (ref 3.0–12.0)
Neutro Abs: 3.7 10*3/uL (ref 1.4–7.7)
WBC: 5.3 10*3/uL (ref 4.5–10.5)

## 2011-08-29 LAB — LIPID PANEL
HDL: 71.1 mg/dL (ref >39.00)
Total CHOL/HDL Ratio: 3
VLDL: 11.4 mg/dL (ref 0.0–40.0)

## 2011-08-29 LAB — BASIC METABOLIC PANEL
BUN: 13 mg/dL (ref 6–23)
Calcium: 9.2 mg/dL (ref 8.4–10.5)
GFR: 77.54 mL/min (ref >60.00)
Potassium: 4 mEq/L (ref 3.5–5.1)
Sodium: 142 mEq/L (ref 135–145)

## 2011-08-29 LAB — TSH: TSH: 0.47 u[IU]/mL (ref 0.35–5.50)

## 2011-09-19 ENCOUNTER — Ambulatory Visit (INDEPENDENT_AMBULATORY_CARE_PROVIDER_SITE_OTHER): Payer: PRIVATE HEALTH INSURANCE | Admitting: Internal Medicine

## 2011-09-19 ENCOUNTER — Encounter: Payer: Self-pay | Admitting: Internal Medicine

## 2011-09-19 VITALS — BP 136/80 | HR 72 | Temp 98.6°F | Resp 16 | Ht 67.0 in | Wt 164.0 lb

## 2011-09-19 DIAGNOSIS — Z0000 Encounter for general adult medical examination without abnormal findings: Secondary | ICD-10-CM

## 2011-09-19 NOTE — Progress Notes (Signed)
Subjective:    Patient ID: Linda Mullins, female    DOB: 1960-03-15, 51 y.o.   MRN: [id number]  HPI  Presents for CPX Breast mass removed benign HRT replacement   Review of Systems  Constitutional: Negative for activity change, appetite change and fatigue.  HENT: Negative for ear pain, congestion, neck pain, postnasal drip and sinus pressure.   Eyes: Negative for redness and visual disturbance.  Respiratory: Negative for cough, shortness of breath and wheezing.   Gastrointestinal: Negative for abdominal pain and abdominal distention.  Genitourinary: Negative for dysuria, frequency and menstrual problem.  Musculoskeletal: Negative for myalgias, joint swelling and arthralgias.  Skin: Negative for rash and wound.  Neurological: Negative for dizziness, weakness and headaches.  Hematological: Negative for adenopathy. Does not bruise/bleed easily.  Psychiatric/Behavioral: Negative for disturbed wake/sleep cycle and decreased concentration.   Past Medical History  Diagnosis Date  . Family history of breast cancer     aunt on father's side  . Arthritis     osteopenia    History   Social History  . Marital Status: Married    Spouse Name: N/A    Number of Children: N/A  . Years of Education: N/A   Occupational History  . Not on file.   Social History Main Topics  . Smoking status: Former Smoker    Quit date: 11/14/1990  . Smokeless tobacco: Never Used  . Alcohol Use: Yes     occasional wine or beer  . Drug Use: No  . Sexually Active: Not on file   Other Topics Concern  . Not on file   Social History Narrative  . No narrative on file    Past Surgical History  Procedure Date  . Abdominal hysterectomy 06/2004    TAH-BSO-Bladder susp  . Tubal ligation   . Breast biopsy 01/24/2011    Procedure: BREAST BIOPSY WITH NEEDLE LOCALIZATION;  Surgeon: Kandis Cocking, MD;  Location: [facility];  Service: General;  Laterality: Left;  LEFT BREAST NEEDLE  LOCILIZED BIOPSY BREAST     No family history on file.  No Known Allergies  Current Outpatient Prescriptions on File Prior to Visit  Medication Sig Dispense Refill  . calcium carbonate (OS-CAL) 600 MG TABS Take 600 mg by mouth daily.        . Cholecalciferol (VITAMIN D) 2000 UNITS CAPS Take by mouth daily.        Marland Kitchen estradiol (VIVELLE-DOT) 0.1 MG/24HR Place 1 patch onto the skin 2 (two) times a week.        . Multiple Vitamin (MULTIVITAMIN) capsule Take 1 capsule by mouth daily.          BP 136/80  Pulse 72  Temp 98.6 F (37 C)  Resp 16  Ht 5\' 7"  (1.702 m)  Wt 164 lb (74.39 kg)  BMI 25.69 kg/m2       Objective:   Physical Exam  Nursing note and vitals reviewed. Constitutional: She is oriented to person, place, and time. She appears well-developed and well-nourished. No distress.  HENT:  Head: Normocephalic and atraumatic.  Right Ear: External ear normal.  Left Ear: External ear normal.  Nose: Nose normal.  Mouth/Throat: Oropharynx is clear and moist.  Eyes: Conjunctivae and EOM are normal. Pupils are equal, round, and reactive to light.  Neck: Normal range of motion. Neck supple. No JVD present. No tracheal deviation present. No thyromegaly present.  Cardiovascular: Normal rate, regular rhythm, normal heart sounds and intact distal pulses.   No  murmur heard. Pulmonary/Chest: Effort normal and breath sounds normal. She has no wheezes. She exhibits no tenderness.  Abdominal: Soft. Bowel sounds are normal.  Musculoskeletal: Normal range of motion. She exhibits no edema and no tenderness.  Lymphadenopathy:    She has no cervical adenopathy.  Neurological: She is alert and oriented to person, place, and time. She has normal reflexes. No cranial nerve deficit.  Skin: Skin is warm and dry. She is not diaphoretic.  Psychiatric: She has a normal mood and affect. Her behavior is normal.          Assessment & Plan:   This is a routine physical examination for this healthy   Female. Reviewed all health maintenance protocols including mammography colonoscopy bone density and reviewed appropriate screening labs. Her immunization history was reviewed as well as her current medications and allergies refills of her chronic medications were given and the plan for yearly health maintenance was discussed all orders and referrals were made as appropriate.  Discussion of HRT replacement

## 2011-12-29 ENCOUNTER — Other Ambulatory Visit: Payer: Self-pay | Admitting: Obstetrics & Gynecology

## 2011-12-29 DIAGNOSIS — Z1231 Encounter for screening mammogram for malignant neoplasm of breast: Secondary | ICD-10-CM

## 2012-02-12 ENCOUNTER — Ambulatory Visit
Admission: RE | Admit: 2012-02-12 | Discharge: 2012-02-12 | Disposition: A | Discharge status: Home / Self Care | Payer: PRIVATE HEALTH INSURANCE | Source: Ambulatory Visit | Attending: Obstetrics & Gynecology | Admitting: Obstetrics & Gynecology

## 2012-02-12 DIAGNOSIS — Z1231 Encounter for screening mammogram for malignant neoplasm of breast: Secondary | ICD-10-CM

## 2013-04-04 ENCOUNTER — Other Ambulatory Visit: Payer: Self-pay | Admitting: Obstetrics & Gynecology

## 2013-04-04 ENCOUNTER — Other Ambulatory Visit: Payer: Self-pay

## 2013-04-04 DIAGNOSIS — Z780 Asymptomatic menopausal state: Secondary | ICD-10-CM

## 2013-04-05 ENCOUNTER — Other Ambulatory Visit: Payer: Self-pay

## 2013-04-05 ENCOUNTER — Other Ambulatory Visit: Payer: Self-pay | Admitting: Obstetrics & Gynecology

## 2013-04-05 DIAGNOSIS — Z1231 Encounter for screening mammogram for malignant neoplasm of breast: Secondary | ICD-10-CM

## 2013-04-21 ENCOUNTER — Ambulatory Visit
Admission: RE | Admit: 2013-04-21 | Discharge: 2013-04-21 | Disposition: A | Discharge status: Home / Self Care | Payer: BC Managed Care – PPO | Source: Ambulatory Visit

## 2013-04-21 ENCOUNTER — Ambulatory Visit
Admission: RE | Admit: 2013-04-21 | Discharge: 2013-04-21 | Disposition: A | Discharge status: Home / Self Care | Payer: BC Managed Care – PPO | Source: Ambulatory Visit | Attending: Obstetrics & Gynecology | Admitting: Obstetrics & Gynecology

## 2013-04-21 DIAGNOSIS — Z780 Asymptomatic menopausal state: Secondary | ICD-10-CM

## 2013-04-21 DIAGNOSIS — Z1231 Encounter for screening mammogram for malignant neoplasm of breast: Secondary | ICD-10-CM

## 2013-09-02 ENCOUNTER — Institutional Professional Consult (permissible substitution): Payer: BC Managed Care – PPO | Admitting: Pulmonary Disease

## 2014-01-02 ENCOUNTER — Other Ambulatory Visit: Payer: Self-pay | Admitting: Obstetrics & Gynecology

## 2014-01-03 LAB — CYTOLOGY - PAP

## 2017-05-07 ENCOUNTER — Ambulatory Visit (INDEPENDENT_AMBULATORY_CARE_PROVIDER_SITE_OTHER): Payer: Managed Care, Other (non HMO) | Admitting: Family Medicine

## 2017-05-07 ENCOUNTER — Encounter: Payer: Self-pay | Admitting: Family Medicine

## 2017-05-07 VITALS — BP 138/66 | HR 93 | Temp 98.2°F | Wt 167.1 lb

## 2017-05-07 DIAGNOSIS — Z0000 Encounter for general adult medical examination without abnormal findings: Secondary | ICD-10-CM | POA: Diagnosis not present

## 2017-05-07 DIAGNOSIS — Z13220 Encounter for screening for lipoid disorders: Secondary | ICD-10-CM

## 2017-05-07 DIAGNOSIS — R413 Other amnesia: Secondary | ICD-10-CM

## 2017-05-07 NOTE — Progress Notes (Signed)
Patient presents to clinic today for cpe and to establish care.  Pt prefers "[facility]".  SUBJECTIVE: PMH: Pt is a 57 yo female with no sig pmh.  Pt was previously seen at [facility] by Dr. Arnoldo Morale.  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  She states she has difficulty remembering how to do certain things that she just wanted. -Patient also endorses difficulty remembering jokes. -Patient also endorses some issues with sleep which may be contributing. -Patient is also taking care elderly parents, her husband, and her kids. -Patient did not know if she is to be concerned. -She also endorses exercising as much as she used to.  This is partly because of the weather  Shortness of breath: -Pt states she is typically active and wants to hike but has not done so in several months.   -Pt also notes when she tried to restart by hiking in the mountains of [place], she felt short of breath quicker than she is used to. -Pt notes she is able to sleep laying flat.  She denies CP, lower extremity edema, changes in weight. -She notes she is less active since her change in job position.  In the past she would constantly walk use the stairs, but now she is mostly sitting in front of a computer all day -Patient denies SOB with normal daily activities  Allergies: NKDA  Past surgical history: Left breast biopsy 2014 Hysterectomy 2/2 fibroids 2004  Social history: Patient is married.  She has 2 children.  She currently works as a Materials engineer.  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  Patient endorses social alcohol use.  Patient denies current tobacco use or drug use.    Family medical history: Mom-alive, arthritis, HTN disease, HLD, stroke -Alive, diabetes, hearing loss, HTN Sister-Eddie, alcohol abuse, Diabetes, HTN Daughter-alive Son-alive MGM-deceased MGF-deceased, MI PGM-deceased, hearing  loss PGF-deceased, stroke   Health Maintenance: Dental --[facility] and [facility] Vision --Howard McFarland Immunizations --vaccine given September 2018 Colonoscopy --last colonoscopy was 5 years ago.  Patient is due for repeat this year Mammogram --scheduled for April 2019 PAP --March 2018.  Scheduled for April 2019.   Past Medical History:  Diagnosis Date  . Arthritis    osteopenia  . Family history of breast cancer    aunt on father's side    Past Surgical History:  Procedure Laterality Date  . ABDOMINAL HYSTERECTOMY  06/2004   TAH-BSO-Bladder susp  . BREAST BIOPSY  01/24/2011   Procedure: BREAST BIOPSY WITH NEEDLE LOCALIZATION;  Surgeon: Shann Medal, MD;  Location: [facility];  Service: General;  Laterality: Left;  LEFT BREAST NEEDLE LOCILIZED BIOPSY BREAST   . TUBAL LIGATION      Current Outpatient Medications on File Prior to Visit  Medication Sig Dispense Refill  . calcium carbonate (OS-CAL) 600 MG TABS Take 600 mg by mouth daily.      . Cholecalciferol (VITAMIN D) 2000 UNITS CAPS Take by mouth daily.      . Multiple Vitamin (MULTIVITAMIN) capsule Take 1 capsule by mouth daily.       No current facility-administered medications on file prior to visit.     No Known Allergies  History reviewed. No pertinent family history.  Social History   Socioeconomic History  . Marital status: Married    Spouse name: Not on file  . Number of children: Not on file  . Years  of education: Not on file  . Highest education level: Not on file  Social Needs  . Financial resource strain: Not on file  . Food insecurity - worry: Not on file  . Food insecurity - inability: Not on file  . Transportation needs - medical: Not on file  . Transportation needs - non-medical: Not on file  Occupational History  . Not on file  Tobacco Use  . Smoking status: Former Smoker    Last attempt to quit: 11/14/1990    Years since quitting: 26.4  . Smokeless tobacco: Never Used   Substance and Sexual Activity  . Alcohol use: Yes    Comment: occasional wine or beer  . Drug use: No  . Sexual activity: Not on file  Other Topics Concern  . Not on file  Social History Narrative  . Not on file    ROS General: Denies fever, chills, night sweats, changes in weight, changes in appetite HEENT: Denies headaches, ear pain, changes in vision, rhinorrhea, sore throat CV: Denies CP, palpitations, orthopnea  +SOB with resuming hiking Pulm: Denies cough, wheezing GI: Denies abdominal pain, nausea, vomiting, diarrhea, constipation GU: Denies dysuria, hematuria, frequency, vaginal discharge Msk: Denies muscle cramps, joint pains Neuro: Denies weakness, numbness, tingling Skin: Denies rashes, bruising Psych: Denies depression, anxiety, hallucinations  BP 138/66 (BP Location: Left Arm, Patient Position: Sitting, Cuff Size: Normal)   Pulse 93   Temp 98.2 F (36.8 C) (Oral)   Wt 167 lb 1.6 oz (75.8 kg)   SpO2 96%   BMI 26.17 kg/m   Physical Exam Gen. Pleasant, well developed, well-nourished, in NAD HEENT - [place]/AT, PERRL, no scleral icterus, no nasal drainage, pharynx without erythema or exudate. Neck: No JVD, no thyromegaly, no carotid bruits Lungs: no use of accessory muscles, CTAB, no wheezes, rales or rhonchi Cardiovascular: RRR, No r/g/m, no peripheral edema Abdomen: BS present, soft, nontender,nondistended Musculoskeletal: No deformities, moves all four extremities, no cyanosis or clubbing, normal tone Neuro:  A&Ox3, CN II-XII intact, normal gait Skin:  Warm, dry, intact, no lesions Psych: normal affect, mood appropriate  No results found for this or any previous visit (from the past 2160 hour(s)).  Assessment/Plan: Well adult exam  -Anticipatory guidance given including wearing seatbelts, smoke detectors in the home, increasing physical activity, increasing p.o. intake of water, increasing p.o. intake of vegetables -Handout given -Patient is also followed by  OB/GYN has Pap scheduled for April 2019 -Mammogram also scheduled for April 2019 -Discussed deconditioning, patient continues to have  SOB she should return to clinic for further eval - Plan: CBC (no diff), Comprehensive metabolic panel  Screening for cholesterol level  - Plan: Lipid panel  Memory difficulties  - Plan: Vitamin D, 25-hydroxy, Vitamin B12, TSH, T4, free  Follow-up PRN  Grier Mitts, MD

## 2017-05-07 NOTE — Patient Instructions (Addendum)
Preventive Care 40-64 Years, Female Preventive care refers to lifestyle choices and visits with your health care provider that can promote health and wellness. What does preventive care include?  A yearly physical exam. This is also called an annual well check.  Dental exams once or twice a year.  Routine eye exams. Ask your health care provider how often you should have your eyes checked.  Personal lifestyle choices, including: ? Daily care of your teeth and gums. ? Regular physical activity. ? Eating a healthy diet. ? Avoiding tobacco and drug use. ? Limiting alcohol use. ? Practicing safe sex. ? Taking low-dose aspirin daily starting at age 58. ? Taking vitamin and mineral supplements as recommended by your health care provider. What happens during an annual well check? The services and screenings done by your health care provider during your annual well check will depend on your age, overall health, lifestyle risk factors, and family history of disease. Counseling Your health care provider may ask you questions about your:  Alcohol use.  Tobacco use.  Drug use.  Emotional well-being.  Home and relationship well-being.  Sexual activity.  Eating habits.  Work and work Statistician.  Method of birth control.  Menstrual cycle.  Pregnancy history.  Screening You may have the following tests or measurements:  Height, weight, and BMI.  Blood pressure.  Lipid and cholesterol levels. These may be checked every 5 years, or more frequently if you are over 81 years old.  Skin check.  Lung cancer screening. You may have this screening every year starting at age 78 if you have a 30-pack-year history of smoking and currently smoke or have quit within the past 15 years.  Fecal occult blood test (FOBT) of the stool. You may have this test every year starting at age 65.  Flexible sigmoidoscopy or colonoscopy. You may have a sigmoidoscopy every 5 years or a colonoscopy  every 10 years starting at age 30.  Hepatitis C blood test.  Hepatitis B blood test.  Sexually transmitted disease (STD) testing.  Diabetes screening. This is done by checking your blood sugar (glucose) after you have not eaten for a while (fasting). You may have this done every 1-3 years.  Mammogram. This may be done every 1-2 years. Talk to your health care provider about when you should start having regular mammograms. This may depend on whether you have a family history of breast cancer.  BRCA-related cancer screening. This may be done if you have a family history of breast, ovarian, tubal, or peritoneal cancers.  Pelvic exam and Pap test. This may be done every 3 years starting at age 80. Starting at age 36, this may be done every 5 years if you have a Pap test in combination with an HPV test.  Bone density scan. This is done to screen for osteoporosis. You may have this scan if you are at high risk for osteoporosis.  Discuss your test results, treatment options, and if necessary, the need for more tests with your health care provider. Vaccines Your health care provider may recommend certain vaccines, such as:  Influenza vaccine. This is recommended every year.  Tetanus, diphtheria, and acellular pertussis (Tdap, Td) vaccine. You may need a Td booster every 10 years.  Varicella vaccine. You may need this if you have not been vaccinated.  Zoster vaccine. You may need this after age 5.  Measles, mumps, and rubella (MMR) vaccine. You may need at least one dose of MMR if you were born in  1957 or later. You may also need a second dose.  Pneumococcal 13-valent conjugate (PCV13) vaccine. You may need this if you have certain conditions and were not previously vaccinated.  Pneumococcal polysaccharide (PPSV23) vaccine. You may need one or two doses if you smoke cigarettes or if you have certain conditions.  Meningococcal vaccine. You may need this if you have certain  conditions.  Hepatitis A vaccine. You may need this if you have certain conditions or if you travel or work in places where you may be exposed to hepatitis A.  Hepatitis B vaccine. You may need this if you have certain conditions or if you travel or work in places where you may be exposed to hepatitis B.  Haemophilus influenzae type b (Hib) vaccine. You may need this if you have certain conditions.  Talk to your health care provider about which screenings and vaccines you need and how often you need them. This information is not intended to replace advice given to you by your health care provider. Make sure you discuss any questions you have with your health care provider. Document Released: 03/23/2015 Document Revised: 11/14/2015 Document Reviewed: 12/26/2014 Elsevier Interactive Patient Education  2018 [place] refers to the changes in your body that occur during a period of inactivity. The changes happen in your heart, lungs, and muscles. They decrease your ability to be active, and they make you feel tired and weak. There are three stages of deconditioning:  Mild deconditioning. At this stage, you will notice a change in your ability to do your usual exercise activities, such as running, biking, or swimming.  Moderate deconditioning. At this stage, you will notice a change in your ability to do normal everyday activities, such as walking, grocery shopping, and doing chores.  Severe deconditioning. At this stage, you will notice a change in your ability to do minimal activity or normal self-care.  Deconditioning can occur after only a few days of inactivity. The longer the period of inactivity, the more severe the deconditioning will be, and the longer it will take to return to your previous level of functioning. What are the causes? Deconditioning is often caused by inactivity due to:  Illnesses, such as cancer, stroke, heart attack, fibromyalgia,  and chronic fatigue syndrome.  Injuries, especially back injuries, broken bones, and ligament and tendon injuries.  A long stay in the hospital.  Pregnancy, especially if long periods of bed rest are needed.  What increases the risk? This condition is more likely to develop in:  People who are hospitalized.  People on bed rest.  People who are obese.  People with poor nutrition.  Elderly adults.  People with injuries or illnesses that interfere with movement and activity.  What are the signs or symptoms? Symptoms of deconditioning include:  Weakness.  Tiredness.  Shortness of breath with minor exertion.  A faster-than-normal heartbeat. You may not notice this without taking your pulse.  Pain or discomfort with activity.  Decreased strength.  Decreased sense of balance.  Decreased endurance.  Difficulty doing your usual forms of exercise.  Difficulty doing activities of daily living, such as grocery shopping or chores.  Difficulty walking around the house and doing basic self-care, such as getting to the bathroom, preparing meals, or doing laundry.  How is this diagnosed? Deconditioning is diagnosed based on your medical history and a physical exam. During the physical exam, your health care provider will check for signs of deconditioning, such as:  Decreased size of muscles.  Decreased strength.  Trouble with balance.  Shortness of breath or abnormally increased heart rate after minor exertion.  How is this treated? Treatment for deconditioning usually involves following a structured exercise program in which activity is increased gradually. Your health care provider will determine which exercises are right for you. The exercise program will likely include aerobic exercise and strength training:  Aerobic exercise helps improve the functioning of the heart and lungs as well as the muscles.  Strength training helps improve muscle size and  strength.  Both of these types of exercise will improve your endurance. You may be referred to a physical therapist who can create a safe strengthening program for you to follow. Follow these instructions at home:  Follow the exercise program that is recommended by your health care provider or physical therapist.  Do not increase your exercise any faster than directed.  Eat a healthy diet.  Do not use any products that contain nicotine or tobacco, such as cigarettes and e-cigarettes. If you need help quitting, ask your health care provider.  Take over-the-counter and prescription medicines only as told by your health care provider.  Keep all follow-up visits as told by your health care provider. This is important. Contact a health care provider if:  You are not able to carry out the prescribed exercise program.  You are becoming more and more fatigued and weak.  You become light-headed when rising to a sitting or standing position.  Your level of endurance decreases after it has improved. Get help right away if:  You have chest pain.  You are very short of breath.  You have any episodes of passing out. This information is not intended to replace advice given to you by your health care provider. Make sure you discuss any questions you have with your health care provider. Document Released: 07/11/2013 Document Revised: 09/14/2015 Document Reviewed: 05/26/2015 Elsevier Interactive Patient Education  [employer].

## 2017-05-08 ENCOUNTER — Encounter: Payer: Self-pay | Admitting: Family Medicine

## 2017-05-12 ENCOUNTER — Other Ambulatory Visit: Payer: Managed Care, Other (non HMO)

## 2017-05-20 ENCOUNTER — Other Ambulatory Visit (INDEPENDENT_AMBULATORY_CARE_PROVIDER_SITE_OTHER): Payer: Managed Care, Other (non HMO)

## 2017-05-20 DIAGNOSIS — Z13220 Encounter for screening for lipoid disorders: Secondary | ICD-10-CM

## 2017-05-20 DIAGNOSIS — Z0000 Encounter for general adult medical examination without abnormal findings: Secondary | ICD-10-CM | POA: Diagnosis not present

## 2017-05-20 DIAGNOSIS — R413 Other amnesia: Secondary | ICD-10-CM | POA: Diagnosis not present

## 2017-05-20 LAB — COMPREHENSIVE METABOLIC PANEL
ALT: 12 U/L (ref 0–35)
AST: 16 U/L (ref 0–37)
Albumin: 4.2 g/dL (ref 3.5–5.2)
Alkaline Phosphatase: 94 U/L (ref 39–117)
BILIRUBIN TOTAL: 0.4 mg/dL (ref 0.2–1.2)
BUN: 15 mg/dL (ref 6–23)
CO2: 30 meq/L (ref 19–32)
Calcium: 9.6 mg/dL (ref 8.4–10.5)
Chloride: 103 mEq/L (ref 96–112)
Creatinine, Ser: 0.77 mg/dL (ref 0.40–1.20)
GFR: 81.66 mL/min (ref >60.00)
Glucose, Bld: 87 mg/dL (ref 70–99)
Potassium: 4 mEq/L (ref 3.5–5.1)
Sodium: 141 mEq/L (ref 135–145)
TOTAL PROTEIN: 6.4 g/dL (ref 6.0–8.3)

## 2017-05-20 LAB — CBC
HCT: 40.1 % (ref 36.0–46.0)
Hemoglobin: 13.1 g/dL (ref 12.0–15.0)
MCHC: 32.8 g/dL (ref 30.0–36.0)
MCV: 86.7 fl (ref 78.0–100.0)
Platelets: 289 10*3/uL (ref 150.0–400.0)
RBC: 4.62 Mil/uL (ref 3.87–5.11)
RDW: 13.8 % (ref 11.5–15.5)
WBC: 5.4 10*3/uL (ref 4.0–10.5)

## 2017-05-20 LAB — LIPID PANEL
Cholesterol: 193 mg/dL (ref 0–200)
HDL: 71.1 mg/dL (ref >39.00)
LDL Cholesterol: 107 mg/dL — ABNORMAL HIGH (ref 0–99)
NonHDL: 121.56
TRIGLYCERIDES: 73 mg/dL (ref 0.0–149.0)
Total CHOL/HDL Ratio: 3
VLDL: 14.6 mg/dL (ref 0.0–40.0)

## 2017-05-20 LAB — VITAMIN D 25 HYDROXY (VIT D DEFICIENCY, FRACTURES): VITD: 32.33 ng/mL (ref 30.00–100.00)

## 2017-05-20 LAB — T4, FREE: Free T4: 0.87 ng/dL (ref 0.60–1.60)

## 2017-05-20 LAB — TSH: TSH: 0.93 u[IU]/mL (ref 0.35–4.50)

## 2017-05-20 LAB — VITAMIN B12: VITAMIN B 12: 313 pg/mL (ref 211–911)

## 2017-05-26 ENCOUNTER — Telehealth: Payer: Self-pay | Admitting: Family Medicine

## 2017-05-26 NOTE — Telephone Encounter (Signed)
Copied from [place]. Topic: Quick Communication - Lab Results >> May 26, 2017  4:22 PM Abelardo Diesel, LPN wrote: Please give pt lab results.  >> May 26, 2017  4:27 PM Bea Graff, NT wrote: Pt calling to lab results. CRM does not state that it is ok for PEC NT to give results. Please call pt.

## 2017-05-27 NOTE — Telephone Encounter (Signed)
Spoke with pt and gave her lab results

## 2017-06-30 ENCOUNTER — Other Ambulatory Visit: Payer: Self-pay | Admitting: Obstetrics and Gynecology

## 2017-06-30 DIAGNOSIS — R928 Other abnormal and inconclusive findings on diagnostic imaging of breast: Secondary | ICD-10-CM

## 2017-07-09 ENCOUNTER — Other Ambulatory Visit: Payer: Self-pay | Admitting: Obstetrics and Gynecology

## 2017-07-09 ENCOUNTER — Ambulatory Visit
Admission: RE | Admit: 2017-07-09 | Discharge: 2017-07-09 | Disposition: A | Discharge status: Home / Self Care | Payer: Managed Care, Other (non HMO) | Source: Ambulatory Visit | Attending: Obstetrics and Gynecology | Admitting: Obstetrics and Gynecology

## 2017-07-09 ENCOUNTER — Ambulatory Visit: Payer: Managed Care, Other (non HMO)

## 2017-07-09 DIAGNOSIS — N6489 Other specified disorders of breast: Secondary | ICD-10-CM

## 2017-07-09 DIAGNOSIS — R928 Other abnormal and inconclusive findings on diagnostic imaging of breast: Secondary | ICD-10-CM

## 2017-07-13 ENCOUNTER — Ambulatory Visit
Admission: RE | Admit: 2017-07-13 | Discharge: 2017-07-13 | Disposition: A | Discharge status: Home / Self Care | Payer: Managed Care, Other (non HMO) | Source: Ambulatory Visit | Attending: Obstetrics and Gynecology | Admitting: Obstetrics and Gynecology

## 2017-07-13 DIAGNOSIS — N6489 Other specified disorders of breast: Secondary | ICD-10-CM

## 2017-08-06 ENCOUNTER — Ambulatory Visit: Payer: Self-pay | Admitting: Surgery

## 2017-08-06 ENCOUNTER — Other Ambulatory Visit: Payer: Self-pay | Admitting: Surgery

## 2017-08-06 DIAGNOSIS — N6320 Unspecified lump in the left breast, unspecified quadrant: Secondary | ICD-10-CM

## 2017-09-16 ENCOUNTER — Encounter (HOSPITAL_BASED_OUTPATIENT_CLINIC_OR_DEPARTMENT_OTHER): Payer: Self-pay | Admitting: *Deleted

## 2017-09-16 ENCOUNTER — Other Ambulatory Visit: Payer: Self-pay

## 2017-09-16 NOTE — Pre-Procedure Instructions (Signed)
PT TO COME Tuesday TO PICK UP ENSURE DRINK BEFORE SHE GOES FOR  HER SEED.

## 2017-09-22 ENCOUNTER — Ambulatory Visit
Admission: RE | Admit: 2017-09-22 | Discharge: 2017-09-22 | Disposition: A | Discharge status: Home / Self Care | Payer: Managed Care, Other (non HMO) | Source: Ambulatory Visit | Attending: Surgery | Admitting: Surgery

## 2017-09-22 DIAGNOSIS — N6320 Unspecified lump in the left breast, unspecified quadrant: Secondary | ICD-10-CM

## 2017-09-22 NOTE — Progress Notes (Signed)
Ensure pre surgery drink given with instructions to complete by 0415 dos, surgical soap given with instructions, pt verbalized understanding. 

## 2017-09-23 NOTE — H&P (Signed)
Linda Mullins  Location: [facility] Patient #: [id number] DOB: 08-Apr-1960 Married / Language: English / Race: White Female  History of Present Illness   The patient is a 57 year old female who presents with a complaint of breast lesion.  The PCP is Dr. Lockie Pares  She sees Dr. Susette Racer for gyn.  She comes by herself.  She had a prior left breast biopsy on 01/24/2011 by Dr. Keturah Barre. Kaleab Frasier that showed a radial scar. She has done well since that time with no further breast problems. She has gotten annual mammograms. She had an architectural distortion in the left breast on a mammogram on 07/09/2017. She underwent a left breast biopsy on 07/13/2017 ([id number]) which showed a complex sclerosing lesion. She has an aunt on her father's side who had breast cancer.  I went over with her the indications and risk of breast biopsy/lumpectomy. This time we would use a seed localization. Risk of surgery include bleeding, infection, nerve injury, and if cancer is found, possibly further surgery. She is ready to go ahead and schedule surgery.  Past Medical History: 1. BSO-TAH - 2005 for benign disease - Dr. Susette Racer  Social History: Married. 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 Eliezer Bottom She has two children - 47 you son (who is getting married Jan 2020), and daughter 73 yo, who has one son (57 yo) and is expecting   Past Surgical History (April Staton, [place]; 08/06/2017 10:08 AM) Breast Biopsy  Left. multiple Breast Mass; Local Excision  Left. Hemorrhoidectomy  Hysterectomy (not due to cancer) - Complete  Oral Surgery   Diagnostic Studies History (April Staton, CMA; 08/06/2017 10:08 AM) Colonoscopy  5-10 years ago Mammogram  within last year Pap Smear  1-5 years ago  Allergies (Tanisha A. Owens Shark, [place]; 08/06/2017 9:45 AM) No Known Drug Allergies [08/06/2017]: Allergies Reconciled   Medication History (Tanisha A. Brown, [place]; 08/06/2017 9:46 AM) Calcium  600 (1500 (600 Ca)MG Tablet, Oral) Active. Multi-Vitamin (Oral) Active. Medications Reconciled  Social History (April Staton, CMA; 08/06/2017 10:08 AM) Alcohol use  Moderate alcohol use. Caffeine use  Coffee. Tobacco use  Former smoker.  Family History (April Staton, [place]; 08/06/2017 10:08 AM) Alcohol Abuse  Brother. Arthritis  Father, Mother. Cerebrovascular Accident  Mother. Colon Polyps  Mother. Diabetes Mellitus  Father. Heart Disease  Mother. Hypertension  Brother, Father, Mother. Thyroid problems  Mother.  Pregnancy / Birth History (April Staton, [place]; 08/06/2017 10:08 AM) Age at menarche  43 years. Age of menopause  <43 Gravida  2 Irregular periods  Length (months) of breastfeeding  3-6 Maternal age  40-30 Para  2  Other Problems (April Staton, CMA; 08/06/2017 10:08 AM) Back Pain  Hemorrhoids  Lump In Breast  Sleep Apnea     Review of Systems (April Staton CMA; 08/06/2017 10:08 AM) General Present- Night Sweats. Not Present- Appetite Loss, Chills, Fatigue, Fever, Weight Gain and Weight Loss. Skin Not Present- Change in Wart/Mole, Dryness, Hives, Jaundice, New Lesions, Non-Healing Wounds, Rash and Ulcer. HEENT Present- Ringing in the Ears, Seasonal Allergies and Wears glasses/contact lenses. Not Present- Earache, Hearing Loss, Hoarseness, Nose Bleed, Oral Ulcers, Sinus Pain, Sore Throat, Visual Disturbances and Yellow Eyes. Respiratory Not Present- Bloody sputum, Chronic Cough, Difficulty Breathing, Snoring and Wheezing. Breast Present- Breast Mass. Not Present- Breast Pain, Nipple Discharge and Skin Changes. Cardiovascular Present- Leg Cramps. Not Present- Chest Pain, Difficulty Breathing Lying Down, Palpitations, Rapid Heart Rate, Shortness of Breath and Swelling of Extremities. Gastrointestinal Not Present- Abdominal  Pain, Bloating, Bloody Stool, Change in Bowel Habits, Chronic diarrhea, Constipation, Difficulty Swallowing, Excessive gas, Gets  full quickly at meals, Hemorrhoids, Indigestion, Nausea, Rectal Pain and Vomiting. Female Genitourinary Present- Urgency. Not Present- Frequency, Nocturia, Painful Urination and Pelvic Pain. Musculoskeletal Present- Back Pain. Not Present- Joint Pain, Joint Stiffness, Muscle Pain, Muscle Weakness and Swelling of Extremities. Neurological Not Present- Decreased Memory, Fainting, Headaches, Numbness, Seizures, Tingling, Tremor, Trouble walking and Weakness. Psychiatric Not Present- Anxiety, Bipolar, Change in Sleep Pattern, Depression, Fearful and Frequent crying. Endocrine Present- Hot flashes. Not Present- Cold Intolerance, Excessive Hunger, Hair Changes, Heat Intolerance and New Diabetes. Hematology Not Present- Blood Thinners, Easy Bruising, Excessive bleeding, Gland problems, HIV and Persistent Infections.  Vitals (Tanisha A. Brown RMA; 08/06/2017 9:45 AM) 08/06/2017 9:45 AM Weight: 164.4 lb Height: 66.5in Body Surface Area: 1.85 m Body Mass Index: 26.14 kg/m  Temp.: 98.72F  Pulse: 86 (Regular)  BP: 132/72 (Sitting, Left Arm, Standard)  Physical Exam  General: WN WF alert and generally healthy appearing. HEENT: Normal. Pupils equal.  Neck: Supple. No mass. No thyroid mass.  Lymph Nodes: No supraclavicular or cervical nodes.  Lungs: Clear to auscultation and symmetric breath sounds. Heart: RRR. No murmur or rub.  Breasts: Right - She has a scar at 4 o'clock. The biopsy site this time is at 1 o'clock. Left - No mass or nodule  Abdomen: Soft. No mass. No tenderness. No hernia. Normal bowel sounds. No abdominal scars. Rectal: Not done.  Extremities: Good strength and ROM in upper and lower extremities.  Neurologic: Grossly intact to motor and sensory function. Psychiatric: Has normal mood and affect. Behavior is normal.    Assessment & Plan  1.  BREAST LESION (N64.9)  Story: Left breast biopsy on 07/13/2017 ([id number]) which showed a complex  sclerosing lesion.  Plan:  1) Left breast lumpecotmy (seed localizaiton)  Addendum Note(Tashira Torre H. Lucia Gaskins MD; 09/22/2017 3:04 PM) Dr. Lucia Gaskins, Pt scheduled for Left RSL lump on 09/24/17. Dr. Mariea Clonts with breast center called to speak with you. He advised that the seed was placed today and is 1cm above the clip.    Should you need to speak with Dr. Mariea Clonts, his cell # is [phone]. Thanks April  2. BSO-TAH - 2005 for benign disease - Dr. Rodney Booze, MD, [facility] Pager: [phone] Office phone:  [phone]

## 2017-09-24 ENCOUNTER — Ambulatory Visit (HOSPITAL_BASED_OUTPATIENT_CLINIC_OR_DEPARTMENT_OTHER): Payer: Managed Care, Other (non HMO) | Admitting: Anesthesiology

## 2017-09-24 ENCOUNTER — Encounter (HOSPITAL_BASED_OUTPATIENT_CLINIC_OR_DEPARTMENT_OTHER)
Admission: RE | Disposition: A | Discharge status: Home / Self Care | Payer: Self-pay | Source: Ambulatory Visit | Attending: Surgery

## 2017-09-24 ENCOUNTER — Encounter (HOSPITAL_BASED_OUTPATIENT_CLINIC_OR_DEPARTMENT_OTHER): Payer: Self-pay | Admitting: Anesthesiology

## 2017-09-24 ENCOUNTER — Ambulatory Visit (HOSPITAL_BASED_OUTPATIENT_CLINIC_OR_DEPARTMENT_OTHER)
Admission: RE | Admit: 2017-09-24 | Discharge: 2017-09-24 | Disposition: A | Discharge status: Home / Self Care | Payer: Managed Care, Other (non HMO) | Source: Ambulatory Visit | Attending: Surgery | Admitting: Surgery

## 2017-09-24 ENCOUNTER — Other Ambulatory Visit: Payer: Self-pay

## 2017-09-24 ENCOUNTER — Ambulatory Visit
Admission: RE | Admit: 2017-09-24 | Discharge: 2017-09-24 | Disposition: A | Discharge status: Home / Self Care | Payer: Managed Care, Other (non HMO) | Source: Ambulatory Visit | Attending: Surgery | Admitting: Surgery

## 2017-09-24 DIAGNOSIS — Z87891 Personal history of nicotine dependence: Secondary | ICD-10-CM | POA: Diagnosis not present

## 2017-09-24 DIAGNOSIS — Z79899 Other long term (current) drug therapy: Secondary | ICD-10-CM | POA: Diagnosis not present

## 2017-09-24 DIAGNOSIS — G4730 Sleep apnea, unspecified: Secondary | ICD-10-CM | POA: Diagnosis not present

## 2017-09-24 DIAGNOSIS — N6320 Unspecified lump in the left breast, unspecified quadrant: Secondary | ICD-10-CM

## 2017-09-24 DIAGNOSIS — N6489 Other specified disorders of breast: Secondary | ICD-10-CM | POA: Diagnosis present

## 2017-09-24 HISTORY — DX: Sleep apnea, unspecified: G47.30

## 2017-09-24 HISTORY — PX: BREAST LUMPECTOMY WITH RADIOACTIVE SEED LOCALIZATION: SHX6424

## 2017-09-24 SURGERY — BREAST LUMPECTOMY WITH RADIOACTIVE SEED LOCALIZATION
Anesthesia: General | Site: Breast | Laterality: Left

## 2017-09-24 MED ORDER — FENTANYL CITRATE (PF) 100 MCG/2ML IJ SOLN
INTRAMUSCULAR | Status: AC
  Filled 2017-09-24: qty 2

## 2017-09-24 MED ORDER — HYDROCODONE-ACETAMINOPHEN 7.5-325 MG PO TABS: 1.0000 | ORAL_TABLET | Freq: Once | ORAL | Status: DC | PRN

## 2017-09-24 MED ORDER — PROPOFOL 500 MG/50ML IV EMUL
INTRAVENOUS | Status: AC
  Filled 2017-09-24: qty 50

## 2017-09-24 MED ORDER — BUPIVACAINE-EPINEPHRINE 0.25% -1:200000 IJ SOLN
INTRAMUSCULAR | Status: DC | PRN
  Administered 2017-09-24: 30 mL

## 2017-09-24 MED ORDER — CELECOXIB 200 MG PO CAPS
200.0000 mg | ORAL_CAPSULE | ORAL | Status: AC
  Administered 2017-09-24: 200 mg via ORAL

## 2017-09-24 MED ORDER — PROPOFOL 10 MG/ML IV BOLUS
INTRAVENOUS | Status: DC | PRN
  Administered 2017-09-24: 140 mg via INTRAVENOUS
  Administered 2017-09-24: 20 mg via INTRAVENOUS

## 2017-09-24 MED ORDER — METOCLOPRAMIDE HCL 5 MG/ML IJ SOLN: 10.0000 mg | Freq: Once | INTRAMUSCULAR | Status: DC | PRN

## 2017-09-24 MED ORDER — ONDANSETRON HCL 4 MG/2ML IJ SOLN
INTRAMUSCULAR | Status: AC
  Filled 2017-09-24: qty 2

## 2017-09-24 MED ORDER — CEFAZOLIN SODIUM-DEXTROSE 2-4 GM/100ML-% IV SOLN
INTRAVENOUS | Status: AC
  Filled 2017-09-24: qty 100

## 2017-09-24 MED ORDER — SCOPOLAMINE 1 MG/3DAYS TD PT72: 1.0000 | MEDICATED_PATCH | Freq: Once | TRANSDERMAL | Status: DC | PRN

## 2017-09-24 MED ORDER — BUPIVACAINE-EPINEPHRINE (PF) 0.25% -1:200000 IJ SOLN
INTRAMUSCULAR | Status: AC
  Filled 2017-09-24: qty 90

## 2017-09-24 MED ORDER — CEFAZOLIN SODIUM-DEXTROSE 2-4 GM/100ML-% IV SOLN: 2.0000 g | INTRAVENOUS | Status: DC

## 2017-09-24 MED ORDER — HYDROCODONE-ACETAMINOPHEN 5-325 MG PO TABS: 1.0000 | ORAL_TABLET | Freq: Four times a day (QID) | ORAL | 0 refills | Status: AC | PRN

## 2017-09-24 MED ORDER — MIDAZOLAM HCL 2 MG/2ML IJ SOLN
INTRAMUSCULAR | Status: AC
  Filled 2017-09-24: qty 2

## 2017-09-24 MED ORDER — CELECOXIB 200 MG PO CAPS
ORAL_CAPSULE | ORAL | Status: AC
  Filled 2017-09-24: qty 1

## 2017-09-24 MED ORDER — MEPERIDINE HCL 25 MG/ML IJ SOLN: 6.2500 mg | INTRAMUSCULAR | Status: DC | PRN

## 2017-09-24 MED ORDER — MIDAZOLAM HCL 2 MG/2ML IJ SOLN
1.0000 mg | INTRAMUSCULAR | Status: DC | PRN
  Administered 2017-09-24: 2 mg via INTRAVENOUS

## 2017-09-24 MED ORDER — ACETAMINOPHEN 500 MG PO TABS
ORAL_TABLET | ORAL | Status: AC
  Filled 2017-09-24: qty 2

## 2017-09-24 MED ORDER — ACETAMINOPHEN 500 MG PO TABS
1000.0000 mg | ORAL_TABLET | ORAL | Status: AC
  Administered 2017-09-24: 1000 mg via ORAL

## 2017-09-24 MED ORDER — FENTANYL CITRATE (PF) 100 MCG/2ML IJ SOLN
50.0000 ug | INTRAMUSCULAR | Status: DC | PRN
  Administered 2017-09-24: 100 ug via INTRAVENOUS
  Administered 2017-09-24: 25 ug via INTRAVENOUS

## 2017-09-24 MED ORDER — CHLORHEXIDINE GLUCONATE CLOTH 2 % EX PADS: 6.0000 | MEDICATED_PAD | Freq: Once | CUTANEOUS | Status: DC

## 2017-09-24 MED ORDER — LACTATED RINGERS IV SOLN
INTRAVENOUS | Status: DC
  Administered 2017-09-24 (×2): via INTRAVENOUS

## 2017-09-24 MED ORDER — DEXAMETHASONE SODIUM PHOSPHATE 10 MG/ML IJ SOLN
INTRAMUSCULAR | Status: AC
  Filled 2017-09-24: qty 1

## 2017-09-24 MED ORDER — LIDOCAINE-EPINEPHRINE 1 %-1:100000 IJ SOLN
INTRAMUSCULAR | Status: AC
  Filled 2017-09-24: qty 1

## 2017-09-24 MED ORDER — FENTANYL CITRATE (PF) 100 MCG/2ML IJ SOLN: 25.0000 ug | INTRAMUSCULAR | Status: DC | PRN

## 2017-09-24 MED ORDER — ONDANSETRON HCL 4 MG/2ML IJ SOLN
INTRAMUSCULAR | Status: DC | PRN
  Administered 2017-09-24: 4 mg via INTRAVENOUS

## 2017-09-24 MED ORDER — DEXAMETHASONE SODIUM PHOSPHATE 4 MG/ML IJ SOLN
INTRAMUSCULAR | Status: DC | PRN
  Administered 2017-09-24: 10 mg via INTRAVENOUS

## 2017-09-24 SURGICAL SUPPLY — 43 items
BINDER BREAST LRG (GAUZE/BANDAGES/DRESSINGS) ×3 IMPLANT
BLADE SURG 10 STRL SS (BLADE) ×3 IMPLANT
BLADE SURG 15 STRL LF DISP TIS (BLADE) ×1 IMPLANT
BLADE SURG 15 STRL SS (BLADE) ×2
CANISTER SUC SOCK COL 7IN (MISCELLANEOUS) IMPLANT
CANISTER SUCT 1200ML W/VALVE (MISCELLANEOUS) ×3 IMPLANT
CHLORAPREP W/TINT 26ML (MISCELLANEOUS) ×3 IMPLANT
CLIP VESOCCLUDE SM WIDE 6/CT (CLIP) IMPLANT
COVER BACK TABLE 60X90IN (DRAPES) ×3 IMPLANT
COVER MAYO STAND STRL (DRAPES) ×3 IMPLANT
COVER PROBE W GEL 5X96 (DRAPES) ×3 IMPLANT
DERMABOND ADVANCED (GAUZE/BANDAGES/DRESSINGS) ×2
DERMABOND ADVANCED .7 DNX12 (GAUZE/BANDAGES/DRESSINGS) ×1 IMPLANT
DEVICE DUBIN W/COMP PLATE 8390 (MISCELLANEOUS) ×3 IMPLANT
DRAPE LAPAROTOMY 100X72 PEDS (DRAPES) ×3 IMPLANT
DRAPE UTILITY XL STRL (DRAPES) ×3 IMPLANT
DRSG PAD ABDOMINAL 8X10 ST (GAUZE/BANDAGES/DRESSINGS) ×3 IMPLANT
ELECT COATED BLADE 2.86 ST (ELECTRODE) ×3 IMPLANT
ELECT REM PT RETURN 9FT ADLT (ELECTROSURGICAL) ×3
ELECTRODE REM PT RTRN 9FT ADLT (ELECTROSURGICAL) ×1 IMPLANT
GAUZE SPONGE 4X4 12PLY STRL LF (GAUZE/BANDAGES/DRESSINGS) ×3 IMPLANT
GLOVE BIO SURGEON STRL SZ 6.5 (GLOVE) ×4 IMPLANT
GLOVE BIO SURGEONS STRL SZ 6.5 (GLOVE) ×2
GLOVE SURG SIGNA 7.5 PF LTX (GLOVE) ×3 IMPLANT
GOWN STRL REUS W/ TWL LRG LVL3 (GOWN DISPOSABLE) ×1 IMPLANT
GOWN STRL REUS W/ TWL XL LVL3 (GOWN DISPOSABLE) ×1 IMPLANT
GOWN STRL REUS W/TWL LRG LVL3 (GOWN DISPOSABLE) ×2
GOWN STRL REUS W/TWL XL LVL3 (GOWN DISPOSABLE) ×2
KIT MARKER MARGIN INK (KITS) ×3 IMPLANT
NEEDLE HYPO 25X1 1.5 SAFETY (NEEDLE) ×3 IMPLANT
NS IRRIG 1000ML POUR BTL (IV SOLUTION) IMPLANT
PACK BASIN DAY SURGERY FS (CUSTOM PROCEDURE TRAY) ×3 IMPLANT
PENCIL BUTTON HOLSTER BLD 10FT (ELECTRODE) ×3 IMPLANT
SLEEVE SCD COMPRESS KNEE MED (MISCELLANEOUS) ×3 IMPLANT
SPONGE LAP 18X18 RF (DISPOSABLE) ×3 IMPLANT
SUT MNCRL AB 4-0 PS2 18 (SUTURE) ×3 IMPLANT
SUT VICRYL 3-0 CR8 SH (SUTURE) ×3 IMPLANT
SYR CONTROL 10ML LL (SYRINGE) ×3 IMPLANT
TOWEL GREEN STERILE FF (TOWEL DISPOSABLE) ×3 IMPLANT
TOWEL OR NON WOVEN STRL DISP B (DISPOSABLE) ×3 IMPLANT
TUBE CONNECTING 20'X1/4 (TUBING) ×1
TUBE CONNECTING 20X1/4 (TUBING) ×2 IMPLANT
YANKAUER SUCT BULB TIP NO VENT (SUCTIONS) ×3 IMPLANT

## 2017-09-24 NOTE — Anesthesia Preprocedure Evaluation (Signed)
Anesthesia Evaluation  Patient identified by MRN, date of birth, ID band Patient awake    Reviewed: Allergy & Precautions, NPO status , Patient's Chart, lab work & pertinent test results  Airway Mallampati: II  TM Distance: >3 FB Neck ROM: Full    Dental  (+) Teeth Intact   Pulmonary sleep apnea , former smoker,    Pulmonary exam normal breath sounds clear to auscultation       Cardiovascular negative cardio ROS Normal cardiovascular exam Rhythm:Regular Rate:Normal     Neuro/Psych negative neurological ROS  negative psych ROS   GI/Hepatic negative GI ROS, Neg liver ROS,   Endo/Other  Left breast mass  Renal/GU negative Renal ROS  negative genitourinary   Musculoskeletal  (+) Arthritis ,   Abdominal   Peds  Hematology negative hematology ROS (+)   Anesthesia Other Findings   Reproductive/Obstetrics                             Anesthesia Physical Anesthesia Plan  ASA: II  Anesthesia Plan: General   Post-op Pain Management:    Induction: Intravenous  PONV Risk Score and Plan: Ondansetron, Midazolam, Treatment may vary due to age or medical condition and Dexamethasone  Airway Management Planned: LMA  Additional Equipment:   Intra-op Plan:   Post-operative Plan: Extubation in OR  Informed Consent: I have reviewed the patients History and Physical, chart, labs and discussed the procedure including the risks, benefits and alternatives for the proposed anesthesia with the patient or authorized representative who has indicated his/her understanding and acceptance.   Dental advisory given  Plan Discussed with: CRNA, Anesthesiologist and Surgeon  Anesthesia Plan Comments:         Anesthesia Quick Evaluation

## 2017-09-24 NOTE — Anesthesia Procedure Notes (Signed)
Procedure Name: LMA Insertion Date/Time: 09/24/2017 7:43 AM Performed by: Maryella Shivers, CRNA Pre-anesthesia Checklist: Patient identified, Emergency Drugs available, Suction available and Patient being monitored Patient Re-evaluated:Patient Re-evaluated prior to induction Oxygen Delivery Method: Circle system utilized Preoxygenation: Pre-oxygenation with 100% oxygen Induction Type: IV induction Ventilation: Mask ventilation without difficulty LMA: LMA inserted LMA Size: 4.0 Number of attempts: 1 Airway Equipment and Method: Bite block Placement Confirmation: positive ETCO2 Tube secured with: Tape Dental Injury: Teeth and Oropharynx as per pre-operative assessment

## 2017-09-24 NOTE — Anesthesia Postprocedure Evaluation (Signed)
Anesthesia Post Note  Patient: Linda Mullins  Procedure(s) Performed: BREAST LUMPECTOMY WITH RADIOACTIVE SEED LOCALIZATION (Left Breast)     Patient location during evaluation: PACU Anesthesia Type: General Level of consciousness: awake and alert and oriented Pain management: pain level controlled Vital Signs Assessment: post-procedure vital signs reviewed and stable Respiratory status: spontaneous breathing, nonlabored ventilation and respiratory function stable Cardiovascular status: blood pressure returned to baseline and stable Postop Assessment: no apparent nausea or vomiting Anesthetic complications: no    Last Vitals:  Vitals:   09/24/17 0845 09/24/17 0900  BP: 104/67 119/81  Pulse: 71 62  Resp: 14 (!) 24  Temp:    SpO2: 100% 99%    Last Pain:  Vitals:   09/24/17 0900  TempSrc:   PainSc: 0-No pain                 Emilly Lavey A.

## 2017-09-24 NOTE — Transfer of Care (Signed)
Immediate Anesthesia Transfer of Care Note  Patient: Linda Mullins  Procedure(s) Performed: BREAST LUMPECTOMY WITH RADIOACTIVE SEED LOCALIZATION (Left Breast)  Patient Location: PACU  Anesthesia Type:General  Level of Consciousness: awake and patient cooperative  Airway & Oxygen Therapy: Patient Spontanous Breathing and Patient connected to face mask oxygen  Post-op Assessment: Report given to RN and Post -op Vital signs reviewed and stable  Post vital signs: Reviewed and stable  Last Vitals:  Vitals Value Taken Time  BP    Temp    Pulse 63 09/24/2017  8:41 AM  Resp 14 09/24/2017  8:41 AM  SpO2 100 % 09/24/2017  8:41 AM  Vitals shown include unvalidated device data.  Last Pain:  Vitals:   09/24/17 0639  TempSrc: Oral  PainSc: 0-No pain         Complications: No apparent anesthesia complications

## 2017-09-24 NOTE — Discharge Instructions (Signed)
[facility] [place] [facility] - DISCHARGE INSTRUCTIONS TO PATIENT  Activity:  Driving - May drive in one day if doing well.  If taking more than one pain pill per day, you should not drive.   Lifting - No lifting more than 15 pounds for 5 days.  Wound Care:   Leave breast binder on for 2 days, then you may remove the binder and shower.  Diet:  As tolerated.  Follow up appointment:  Call Dr. Pollie Friar office [facility]) at [phone] for an appointment in 2 - 4 weeks.  Medications and dosages:  Resume your home medications.  You have a prescription for:  Vicodin  Call Dr. Lucia Gaskins or his office  [phone]) if you have:  Temperature greater than 100.4,  Persistent nausea and vomiting,  Severe uncontrolled pain,  Redness, tenderness, or signs of infection (pain, swelling, redness, odor or green/yellow discharge around the site),  Difficulty breathing, headache or visual disturbances,  Any other questions or concerns you may have after discharge.  In an emergency, call 911 or go to an Emergency Department at a nearby hospital.    Post Anesthesia Home Care Instructions  Activity: Get plenty of rest for the remainder of the day. A responsible individual must stay with you for 24 hours following the procedure.  For the next 24 hours, DO NOT: -Drive a car -Paediatric nurse -Drink alcoholic beverages -Take any medication unless instructed by your physician -Make any legal decisions or sign important papers.  Meals: Start with liquid foods such as gelatin or soup. Progress to regular foods as tolerated. Avoid greasy, spicy, heavy foods. If nausea and/or vomiting occur, drink only clear liquids until the nausea and/or vomiting subsides. Call your physician if vomiting continues.  Special Instructions/Symptoms: Your throat may feel dry or sore from the anesthesia or the breathing tube placed in your throat during surgery. If this causes discomfort, gargle with warm  salt water. The discomfort should disappear within 24 hours.  If you had a scopolamine patch placed behind your ear for the management of post- operative nausea and/or vomiting:  1. The medication in the patch is effective for 72 hours, after which it should be removed.  Wrap patch in a tissue and discard in the trash. Wash hands thoroughly with soap and water. 2. You may remove the patch earlier than 72 hours if you experience unpleasant side effects which may include dry mouth, dizziness or visual disturbances. 3. Avoid touching the patch. Wash your hands with soap and water after contact with the patch.

## 2017-09-24 NOTE — Op Note (Addendum)
09/24/2017  8:36 AM  PATIENT:  Linda Mullins DOB: 11-03-1960 MRN: [id number]  PREOP DIAGNOSIS:   LEFT BREAST COMPLEX SCLEROSING LESION  POSTOP DIAGNOSIS:    Left breast complex sclerosing lesion, 12 o'clock  PROCEDURE:   Procedure(s): Left BREAST LUMPECTOMY WITH RADIOACTIVE SEED LOCALIZATION  SURGEON:   Alphonsa Overall, M.D.  ANESTHESIA:   general  Anesthesiologist: Josephine Igo, MD CRNA: Maryella Shivers, CRNA  General  EBL:  minimal  ml  DRAINS:  none   LOCAL MEDICATIONS USED:   30 cc 1/4% marcaine  SPECIMEN:   Left breast biopsy (painted with 6 color paint)  COUNTS CORRECT:  YES  INDICATIONS FOR PROCEDURE:  Linda Mullins is a 57 y.o. (DOB: 1960-10-06) white female whose primary care physician is Billie Ruddy, MD and comes for left breast lumpectomy.  She had a biopsy of her left breast on 07/13/2017 which showed a complex sclerosing lesion.  She now comes for an excision of that area of the breast..   The options for breast cancer treatment have been discussed with the patient. She elected to proceed with lumpectomy and axillary sentinel lymph node.     The indications and potential complications of surgery were explained to the patient. Potential complications include, but are not limited to, bleeding, infection, the need for further surgery, and nerve injury.     She had a I131 seed placed on 09/22/2017 in her left breast at [facility].  The seed is in the 12 o'clock position of the left breast.  OPERATIVE NOTE:   The patient was taken to operating room # 8 at [facility] where she underwent a general anesthesia  supervised by Anesthesiologist: Josephine Igo, MD CRNA: Maryella Shivers, CRNA. Her left breast and axilla was prepped with  ChloraPrep and sterilely draped.    A time-out and the surgical check list was reviewed.    The complex sclerosing lesion was about at the 12 o'clock position of the left breast.   I used the Neoprobe to  identify the I131 seed.  The seed was about 1 cm cranial to the clip.  I made a superiorly based circumareolar incision.  The mass/clip were about 1 cm cranial to the edge of the areola.  I tried to excise an area around the abnormality of at least 1 cm.    I excised this block of breast tissue approximately 3 cm by 4 cm  in diameter.   I painted the lumpectomy specimen with the 6 color paint kit and did a specimen mammogram which confirmed the mass, clip, and the seed were all in the right position in the specimen.  The specimen was sent to pathology who called back to confirm that they have the seed and the specimen.   I then irrigated the wound with saline. I infiltrated approximately 30 mL of 1/4% Marcaine between the incisions.  I then closed all the wounds in layers using 3-0 Vicryl sutures for the deep layer. At the skin, I closed the incisions with a 4-0 Monocryl suture. The incisions were then painted with Dermabond.  She had gauze place over the wounds and placed in a breast binder.   The patient tolerated the procedure well, was transported to the recovery room in good condition. Sponge and needle count were correct at the end of the case.   Final pathology is pending.   Alphonsa Overall, MD, [facility] Pager: [phone] Office phone:  [phone]

## 2017-09-24 NOTE — Interval H&P Note (Signed)
History and Physical Interval Note:  09/24/2017 7:41 AM  Linda Mullins  has presented today for surgery, with the diagnosis of LEFT BREAST COMPLEX SCLEROSING LESION  The various methods of treatment have been discussed with the patient and family.   She is here with her husband.  Seed in place.  After consideration of risks, benefits and other options for treatment, the patient has consented to  Procedure(s): BREAST LUMPECTOMY WITH RADIOACTIVE SEED LOCALIZATION (Left) as a surgical intervention .  The patient's history has been reviewed, patient examined, no change in status, stable for surgery.  I have reviewed the patient's chart and labs.  Questions were answered to the patient's satisfaction.     Shann Medal

## 2017-09-25 ENCOUNTER — Encounter (HOSPITAL_BASED_OUTPATIENT_CLINIC_OR_DEPARTMENT_OTHER): Payer: Self-pay | Admitting: Surgery

## 2018-09-12 IMAGING — MG STEREOTACTIC CORE NEEDLE BIOPSY
1 series · 1 of 1 positions shown · non-contrast
Comparison: Previous exams.

ADDENDUM:
Pathology revealed COMPLEX SCLEROSING LESION WITH CALCIFICATIONS of
LEFT breast, upper outer. This was found to be concordant by Dr.
Gongadze Kyuregyan, with excision recommended.

Pathology results were discussed with the patient by telephone. The
patient reported doing well after the biopsy with tenderness at the
site. Post biopsy instructions and care were reviewed and questions
were answered. The patient was encouraged to call The [REDACTED]
Surgical consultation has been arranged with Dr. Quirijn Amazigh, per
patient request, at [REDACTED] on August 06, 2017.
Pathology results reported by Inga Pete, RN on 07/14/2017.
CLINICAL DATA: The patient presents for stereotactic guided core
biopsy of distortion in the LEFT breast.
EXAM:
LEFT BREAST STEREOTACTIC CORE NEEDLE BIOPSY

[L CC]
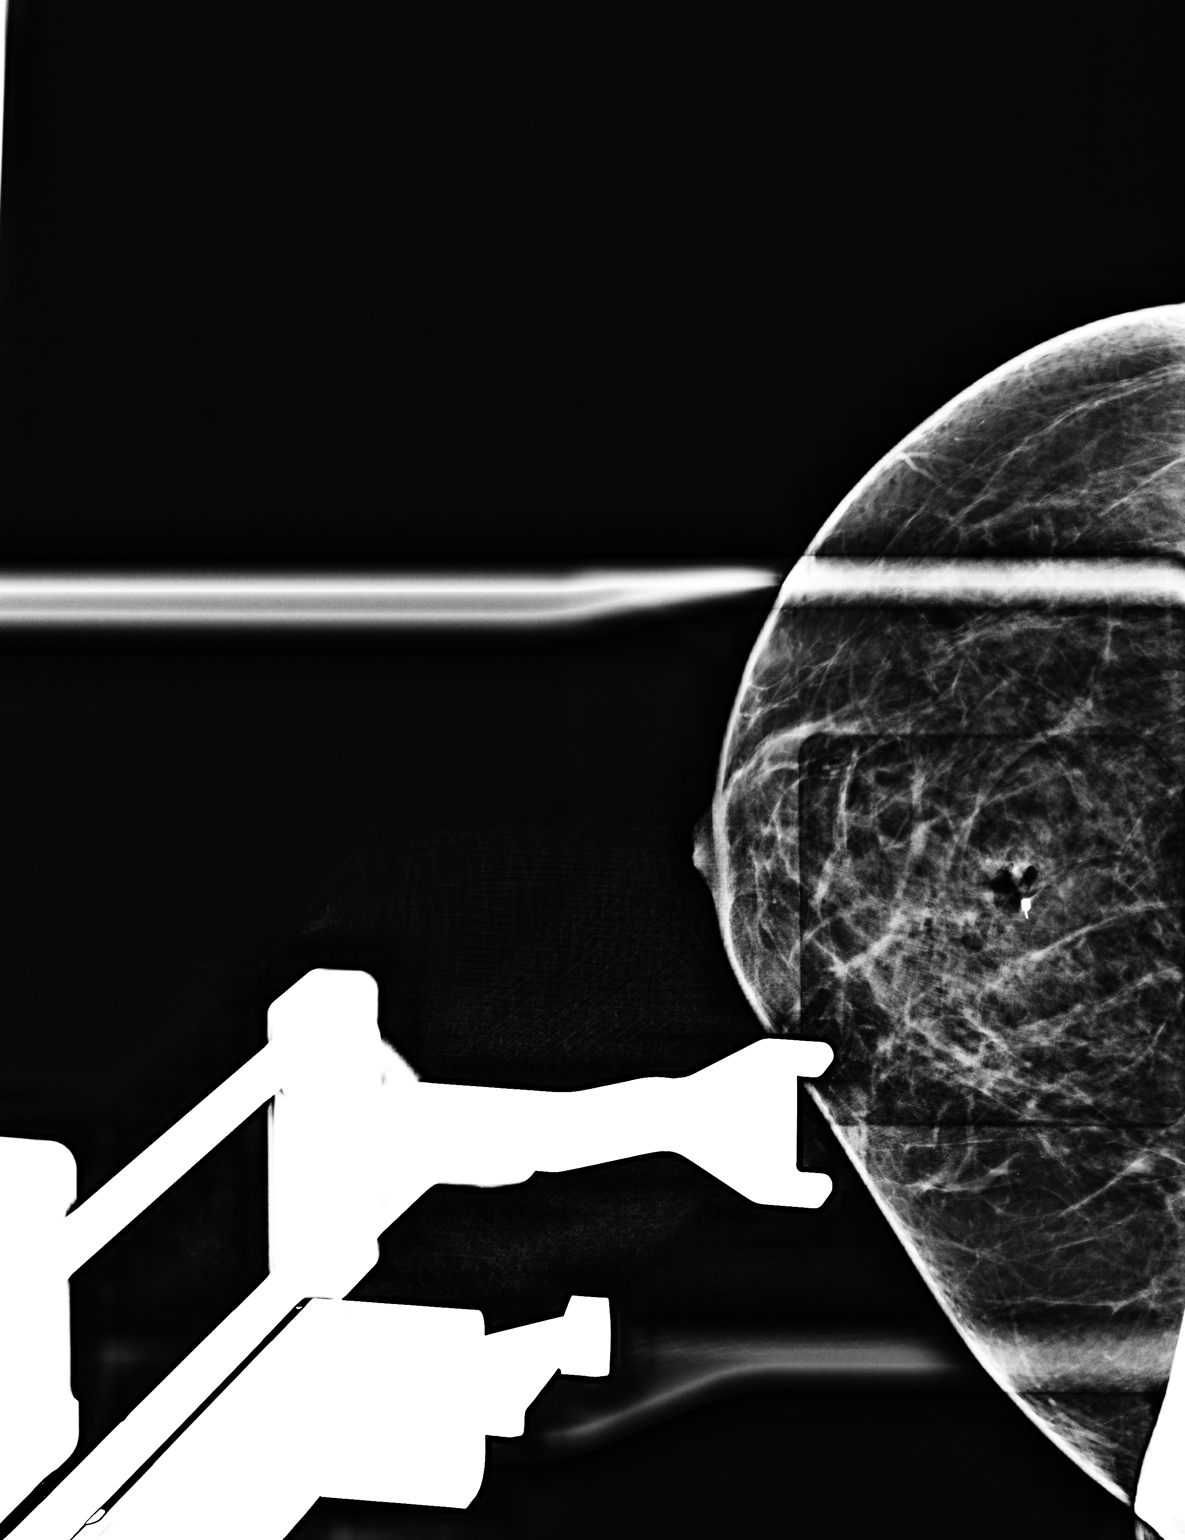

[1 of 1 positions shown; findings below may reference images not displayed]



Using sterile technique and 1% Lidocaine as local anesthetic, under
stereotactic guidance, a 9 gauge vacuum assisted device was used to
perform core needle biopsy of distortion in the anterior UPPER OUTER
QUADRANT of the LEFT breast using a superior to inferior approach.

Lesion quadrant: UPPER-OUTER QUADRANT LEFT breast

At the conclusion of the procedure, a coil shaped tissue marker clip
was deployed into the biopsy cavity. Follow-up 2-view mammogram was
performed and dictated separately.
IMPRESSION: Stereotactic-guided biopsy of LEFT breast distortion. No apparent
complications.

## 2018-11-22 IMAGING — MG NEEDLE LOCALIZATION OF THE LEFT BREAST WITH MAMMO GUIDANCE
2 series · 2 of 2 positions shown · non-contrast
Comparison: Previous exam(s).

CLINICAL DATA: Pre surgical excision localization of a recently
biopsied complex sclerosing lesion in the upper-outer quadrant of
the left breast.

EXAM:
MAMMOGRAPHIC GUIDED RADIOACTIVE SEED LOCALIZATION OF THE LEFT BREAST

[L ML]
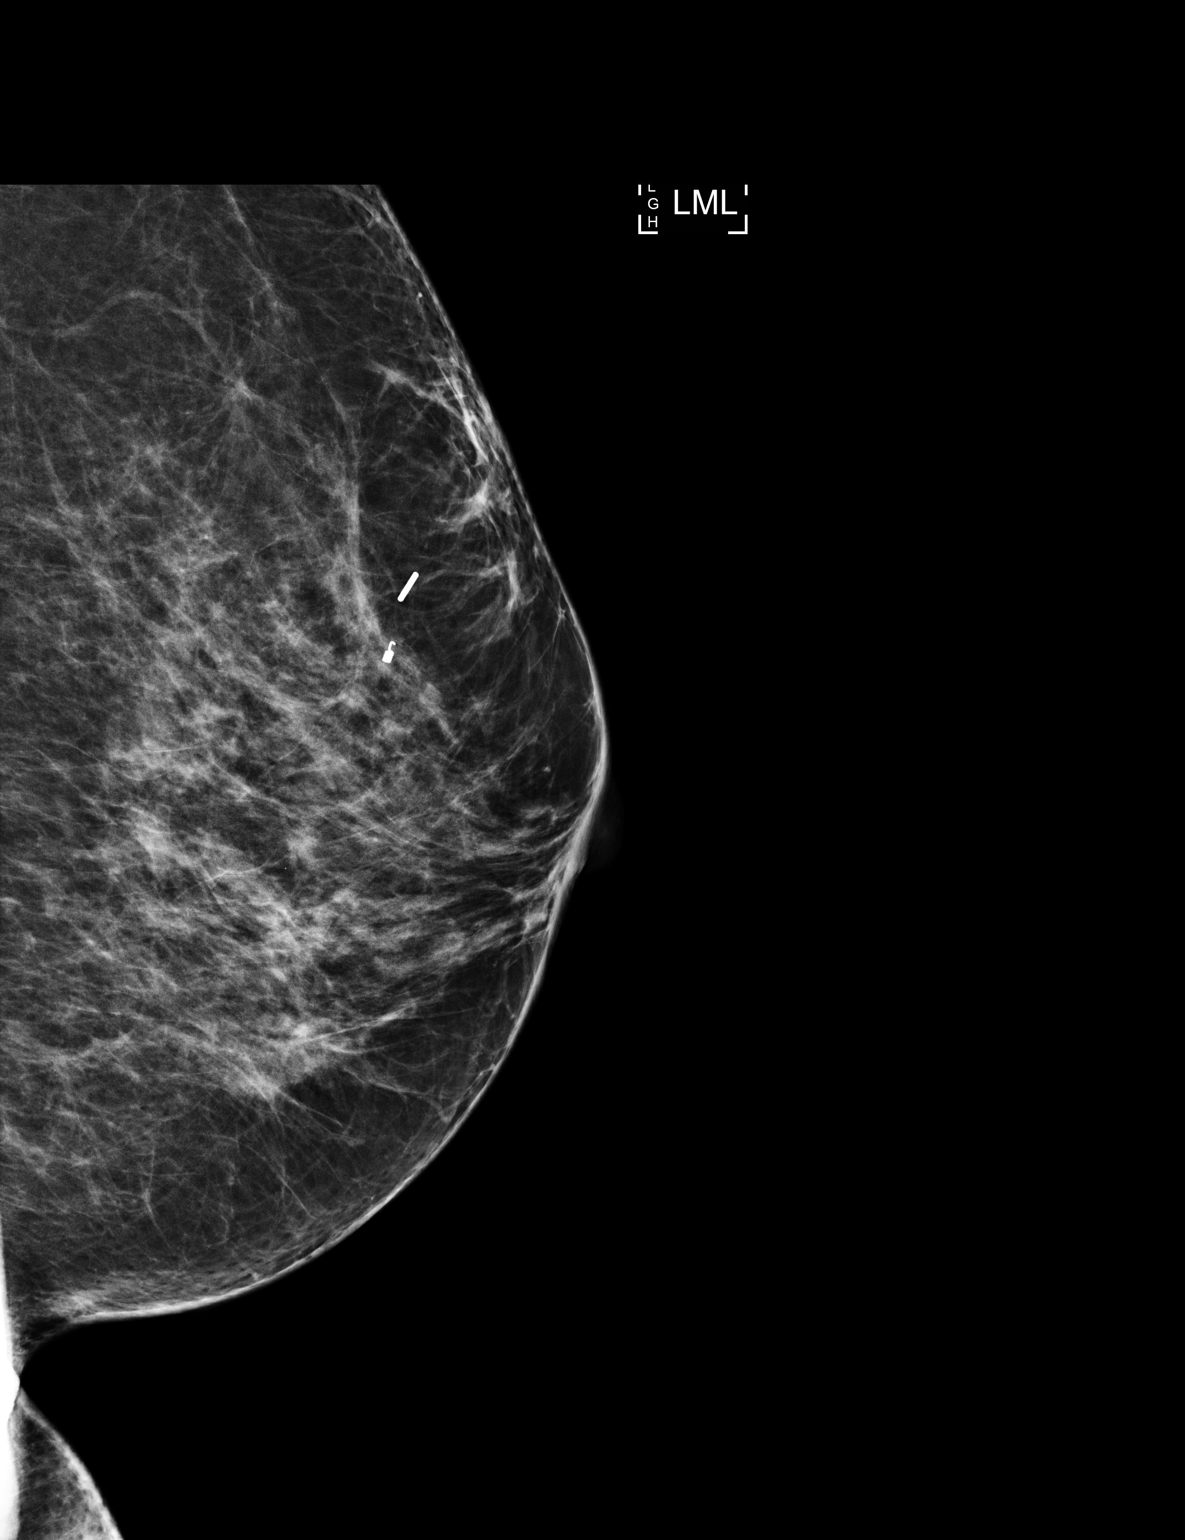

[L CC]
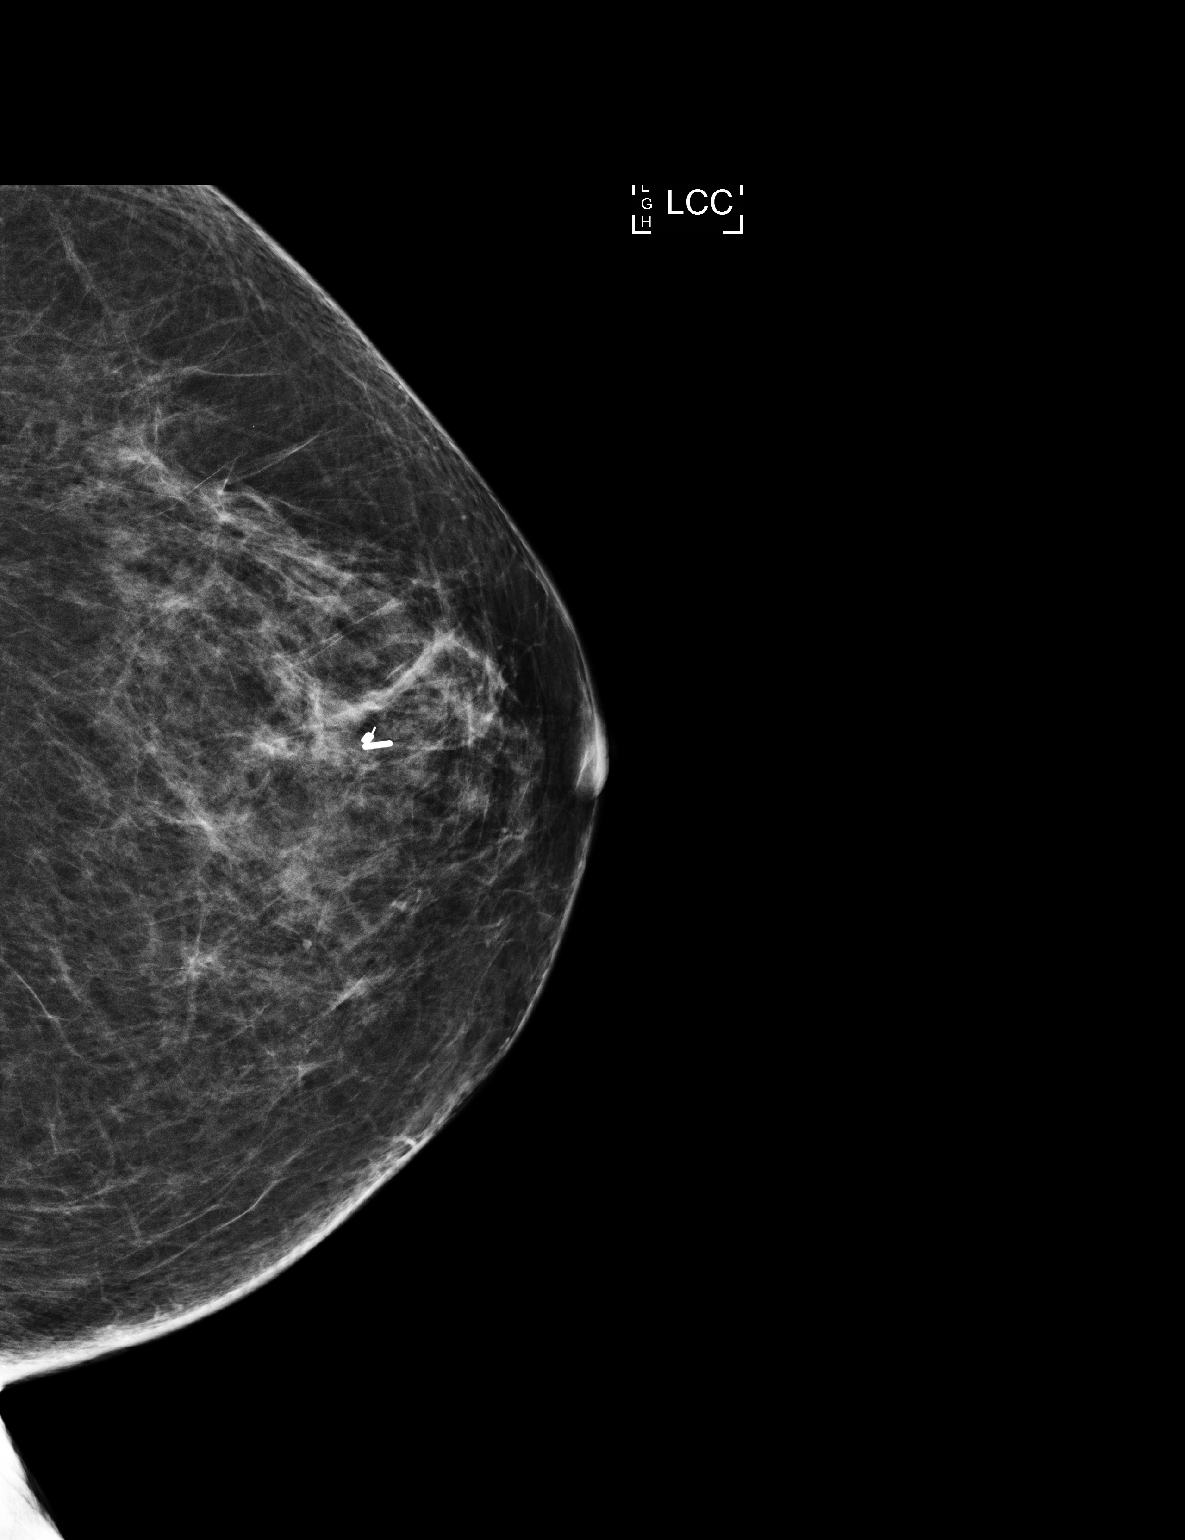

[2 of 2 positions shown; findings below may reference images not displayed]

FINDINGS: Patient presents for radioactive seed localization prior to left
breast surgical excision. I met with the patient and we discussed
the procedure of seed localization including benefits and
alternatives. We discussed the high likelihood of a successful
procedure. We discussed the risks of the procedure including
infection, bleeding, tissue injury and further surgery. We discussed
the low dose of radioactivity involved in the procedure. Informed,
written consent was given.

The usual time-out protocol was performed immediately prior to the
procedure.

Using mammographic guidance, sterile technique, 1% lidocaine and an
S-0IV radioactive seed, the recently placed coil shaped biopsy
marker clip in the upper-outer quadrant of the left breast was
localized using a cephalad approach. The follow-up mammogram images
confirm the seed in the expected location and were marked for Dr.
Bamboom. The seed is located 1 cm superior to the coil shaped biopsy
marker clip. This was discussed with Dr. Bamboom.

Follow-up survey of the patient confirms presence of the radioactive
seed.

Order number of S-0IV seed:  [id number].

Total activity:  0.251 mCi reference Date: 09/11/2017

The patient tolerated the procedure well and was released from the
[REDACTED]. She was given instructions regarding seed removal.
IMPRESSION: Radioactive seed localization left breast. No apparent
complications.

## 2018-11-24 IMAGING — MG BREAST SURGICAL SPECIMEN
1 series · 1 of 1 positions shown · non-contrast
Comparison: Previous exam(s).

CLINICAL DATA: Post left breast excision.

EXAM:
SPECIMEN RADIOGRAPH OF THE LEFT BREAST

[L]
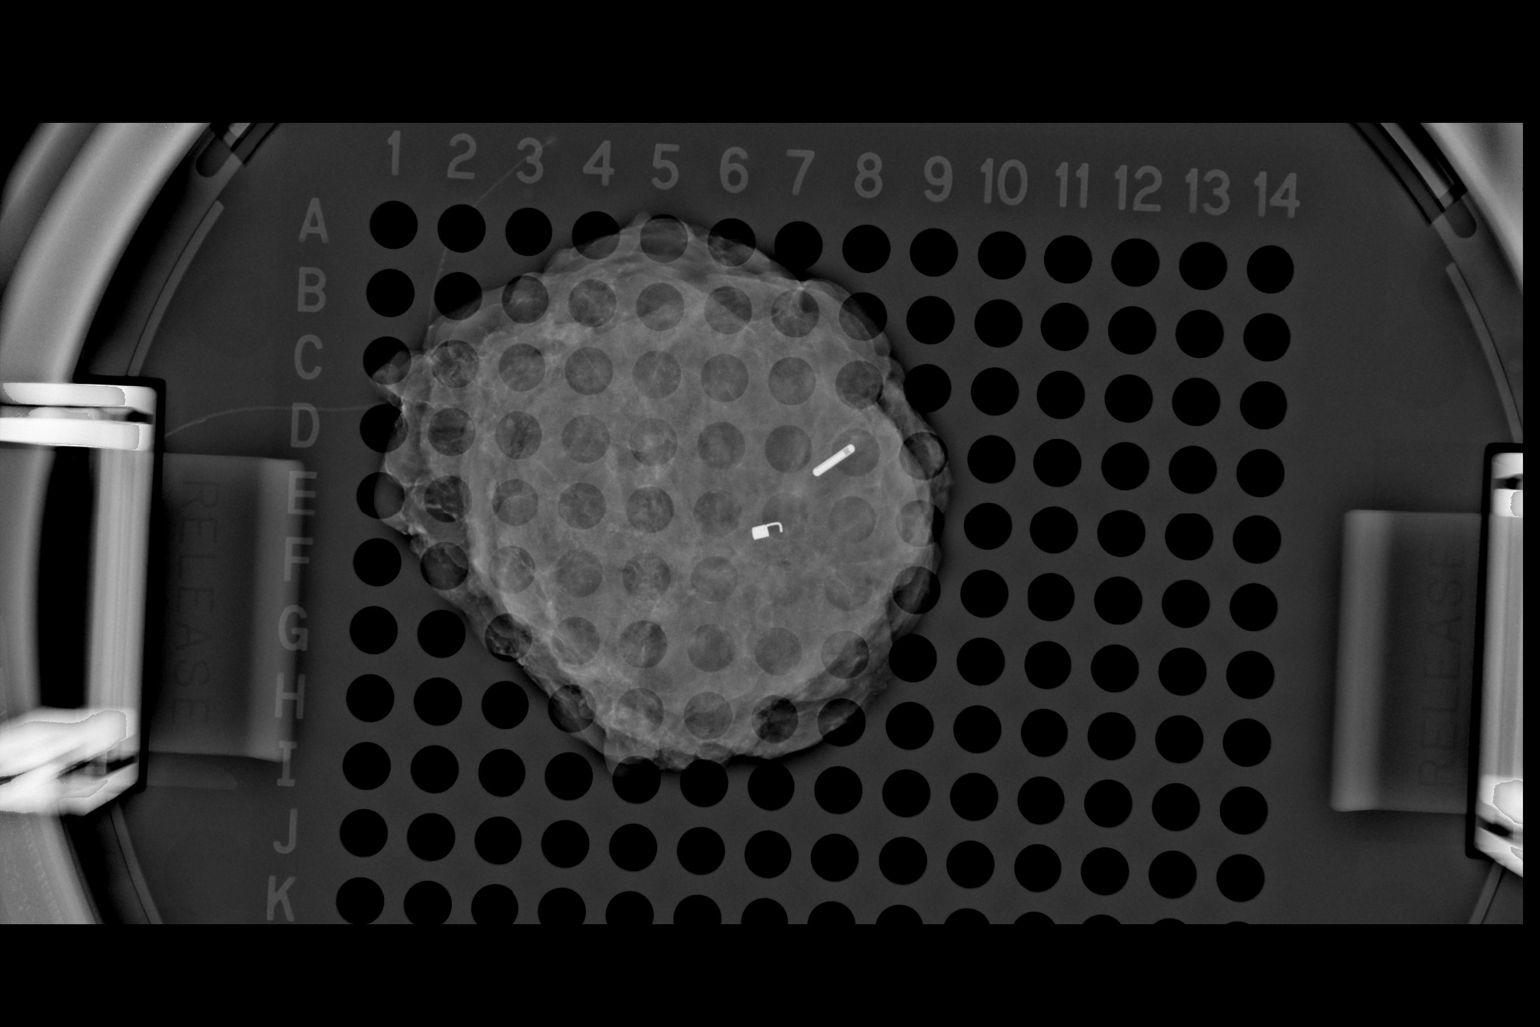

[1 of 1 positions shown; findings below may reference images not displayed]

FINDINGS: Status post excision of the left breast. The radioactive seed and
biopsy marker clip are present, completely intact, and were marked
for pathology.
IMPRESSION: Specimen radiograph of the left breast.

## 2019-12-06 DIAGNOSIS — Z1231 Encounter for screening mammogram for malignant neoplasm of breast: Secondary | ICD-10-CM | POA: Diagnosis not present

## 2019-12-06 DIAGNOSIS — Z6826 Body mass index (BMI) 26.0-26.9, adult: Secondary | ICD-10-CM | POA: Diagnosis not present

## 2019-12-06 DIAGNOSIS — Z13820 Encounter for screening for osteoporosis: Secondary | ICD-10-CM | POA: Diagnosis not present

## 2019-12-06 DIAGNOSIS — Z01419 Encounter for gynecological examination (general) (routine) without abnormal findings: Secondary | ICD-10-CM | POA: Diagnosis not present

## 2020-03-22 DIAGNOSIS — Z20822 Contact with and (suspected) exposure to covid-19: Secondary | ICD-10-CM | POA: Diagnosis not present

## 2020-03-22 DIAGNOSIS — U071 COVID-19: Secondary | ICD-10-CM | POA: Diagnosis not present

## 2020-04-12 DIAGNOSIS — R002 Palpitations: Secondary | ICD-10-CM | POA: Diagnosis not present

## 2020-04-12 DIAGNOSIS — M8580 Other specified disorders of bone density and structure, unspecified site: Secondary | ICD-10-CM | POA: Diagnosis not present

## 2020-04-12 DIAGNOSIS — L718 Other rosacea: Secondary | ICD-10-CM | POA: Diagnosis not present

## 2020-04-12 DIAGNOSIS — F419 Anxiety disorder, unspecified: Secondary | ICD-10-CM | POA: Diagnosis not present

## 2020-04-12 DIAGNOSIS — Z13220 Encounter for screening for lipoid disorders: Secondary | ICD-10-CM | POA: Diagnosis not present

## 2020-04-12 DIAGNOSIS — Z23 Encounter for immunization: Secondary | ICD-10-CM | POA: Diagnosis not present

## 2020-04-24 ENCOUNTER — Telehealth: Payer: Self-pay | Admitting: *Deleted

## 2020-04-24 ENCOUNTER — Other Ambulatory Visit: Payer: Self-pay | Admitting: *Deleted

## 2020-04-24 DIAGNOSIS — R002 Palpitations: Secondary | ICD-10-CM

## 2020-04-24 NOTE — Telephone Encounter (Signed)
LMVM  Dr. Daiva Eves has ordered a 14 day cardiac event monitor. Your insurance information on file is outdated.  Please contact [place] at [phone] to provide updated insurance information.  We will then enroll you for the [employer] monitor company to ship the cardiac event monitor directly to your home.

## 2020-06-07 ENCOUNTER — Ambulatory Visit (INDEPENDENT_AMBULATORY_CARE_PROVIDER_SITE_OTHER): Payer: BC Managed Care – PPO

## 2020-06-07 DIAGNOSIS — R002 Palpitations: Secondary | ICD-10-CM | POA: Diagnosis not present

## 2020-09-17 DIAGNOSIS — K621 Rectal polyp: Secondary | ICD-10-CM | POA: Diagnosis not present

## 2020-09-17 DIAGNOSIS — Z1211 Encounter for screening for malignant neoplasm of colon: Secondary | ICD-10-CM | POA: Diagnosis not present

## 2020-10-10 DIAGNOSIS — Z6826 Body mass index (BMI) 26.0-26.9, adult: Secondary | ICD-10-CM | POA: Diagnosis not present

## 2020-10-10 DIAGNOSIS — Z1331 Encounter for screening for depression: Secondary | ICD-10-CM | POA: Diagnosis not present

## 2020-10-10 DIAGNOSIS — L718 Other rosacea: Secondary | ICD-10-CM | POA: Diagnosis not present

## 2020-10-10 DIAGNOSIS — F419 Anxiety disorder, unspecified: Secondary | ICD-10-CM | POA: Diagnosis not present

## 2021-04-10 DIAGNOSIS — M545 Low back pain, unspecified: Secondary | ICD-10-CM | POA: Diagnosis not present

## 2021-04-10 DIAGNOSIS — F419 Anxiety disorder, unspecified: Secondary | ICD-10-CM | POA: Diagnosis not present

## 2021-04-10 DIAGNOSIS — Z13220 Encounter for screening for lipoid disorders: Secondary | ICD-10-CM | POA: Diagnosis not present

## 2021-04-10 DIAGNOSIS — Z23 Encounter for immunization: Secondary | ICD-10-CM | POA: Diagnosis not present

## 2021-04-10 DIAGNOSIS — L718 Other rosacea: Secondary | ICD-10-CM | POA: Diagnosis not present

## 2021-05-23 DIAGNOSIS — R200 Anesthesia of skin: Secondary | ICD-10-CM | POA: Diagnosis not present

## 2021-05-23 DIAGNOSIS — M25531 Pain in right wrist: Secondary | ICD-10-CM | POA: Diagnosis not present

## 2021-06-07 DIAGNOSIS — G5601 Carpal tunnel syndrome, right upper limb: Secondary | ICD-10-CM | POA: Diagnosis not present

## 2021-06-13 DIAGNOSIS — G5621 Lesion of ulnar nerve, right upper limb: Secondary | ICD-10-CM | POA: Diagnosis not present

## 2021-06-13 DIAGNOSIS — R200 Anesthesia of skin: Secondary | ICD-10-CM | POA: Diagnosis not present

## 2021-06-13 DIAGNOSIS — G5601 Carpal tunnel syndrome, right upper limb: Secondary | ICD-10-CM | POA: Diagnosis not present

## 2021-07-01 DIAGNOSIS — Z1272 Encounter for screening for malignant neoplasm of vagina: Secondary | ICD-10-CM | POA: Diagnosis not present

## 2021-07-01 DIAGNOSIS — Z6826 Body mass index (BMI) 26.0-26.9, adult: Secondary | ICD-10-CM | POA: Diagnosis not present

## 2021-07-01 DIAGNOSIS — Z1231 Encounter for screening mammogram for malignant neoplasm of breast: Secondary | ICD-10-CM | POA: Diagnosis not present

## 2021-07-01 DIAGNOSIS — Z01419 Encounter for gynecological examination (general) (routine) without abnormal findings: Secondary | ICD-10-CM | POA: Diagnosis not present

## 2021-10-08 DIAGNOSIS — L718 Other rosacea: Secondary | ICD-10-CM | POA: Diagnosis not present

## 2021-10-08 DIAGNOSIS — F419 Anxiety disorder, unspecified: Secondary | ICD-10-CM | POA: Diagnosis not present

## 2021-10-08 DIAGNOSIS — Z6826 Body mass index (BMI) 26.0-26.9, adult: Secondary | ICD-10-CM | POA: Diagnosis not present

## 2022-02-19 DIAGNOSIS — L570 Actinic keratosis: Secondary | ICD-10-CM | POA: Diagnosis not present

## 2022-02-19 DIAGNOSIS — X32XXXA Exposure to sunlight, initial encounter: Secondary | ICD-10-CM | POA: Diagnosis not present

## 2022-04-09 DIAGNOSIS — E7800 Pure hypercholesterolemia, unspecified: Secondary | ICD-10-CM | POA: Diagnosis not present

## 2022-04-09 DIAGNOSIS — Z131 Encounter for screening for diabetes mellitus: Secondary | ICD-10-CM | POA: Diagnosis not present

## 2022-04-09 DIAGNOSIS — L718 Other rosacea: Secondary | ICD-10-CM | POA: Diagnosis not present

## 2022-04-09 DIAGNOSIS — F419 Anxiety disorder, unspecified: Secondary | ICD-10-CM | POA: Diagnosis not present

## 2022-04-09 DIAGNOSIS — Z23 Encounter for immunization: Secondary | ICD-10-CM | POA: Diagnosis not present

## 2022-04-15 DIAGNOSIS — Z131 Encounter for screening for diabetes mellitus: Secondary | ICD-10-CM | POA: Diagnosis not present

## 2022-04-15 DIAGNOSIS — E7800 Pure hypercholesterolemia, unspecified: Secondary | ICD-10-CM | POA: Diagnosis not present

## 2022-04-15 DIAGNOSIS — Z79899 Other long term (current) drug therapy: Secondary | ICD-10-CM | POA: Diagnosis not present

## 2022-10-13 DIAGNOSIS — L718 Other rosacea: Secondary | ICD-10-CM | POA: Diagnosis not present

## 2022-10-13 DIAGNOSIS — F419 Anxiety disorder, unspecified: Secondary | ICD-10-CM | POA: Diagnosis not present

## 2022-10-13 DIAGNOSIS — E7800 Pure hypercholesterolemia, unspecified: Secondary | ICD-10-CM | POA: Diagnosis not present

## 2022-12-10 DIAGNOSIS — Z6827 Body mass index (BMI) 27.0-27.9, adult: Secondary | ICD-10-CM | POA: Diagnosis not present

## 2022-12-10 DIAGNOSIS — Z1231 Encounter for screening mammogram for malignant neoplasm of breast: Secondary | ICD-10-CM | POA: Diagnosis not present

## 2022-12-10 DIAGNOSIS — Z01419 Encounter for gynecological examination (general) (routine) without abnormal findings: Secondary | ICD-10-CM | POA: Diagnosis not present

## 2023-04-20 DIAGNOSIS — E7800 Pure hypercholesterolemia, unspecified: Secondary | ICD-10-CM | POA: Diagnosis not present

## 2023-04-20 DIAGNOSIS — Z23 Encounter for immunization: Secondary | ICD-10-CM | POA: Diagnosis not present

## 2023-04-20 DIAGNOSIS — L718 Other rosacea: Secondary | ICD-10-CM | POA: Diagnosis not present

## 2023-04-20 DIAGNOSIS — F419 Anxiety disorder, unspecified: Secondary | ICD-10-CM | POA: Diagnosis not present

## 2023-04-20 DIAGNOSIS — Z1331 Encounter for screening for depression: Secondary | ICD-10-CM | POA: Diagnosis not present

## 2023-04-20 DIAGNOSIS — Z131 Encounter for screening for diabetes mellitus: Secondary | ICD-10-CM | POA: Diagnosis not present

## 2023-10-26 DIAGNOSIS — L718 Other rosacea: Secondary | ICD-10-CM | POA: Diagnosis not present

## 2023-10-26 DIAGNOSIS — F419 Anxiety disorder, unspecified: Secondary | ICD-10-CM | POA: Diagnosis not present

## 2023-10-26 DIAGNOSIS — Z23 Encounter for immunization: Secondary | ICD-10-CM | POA: Diagnosis not present

## 2024-02-08 DIAGNOSIS — Z01419 Encounter for gynecological examination (general) (routine) without abnormal findings: Secondary | ICD-10-CM | POA: Diagnosis not present

## 2024-02-08 DIAGNOSIS — Z6827 Body mass index (BMI) 27.0-27.9, adult: Secondary | ICD-10-CM | POA: Diagnosis not present

## 2024-02-08 DIAGNOSIS — M8588 Other specified disorders of bone density and structure, other site: Secondary | ICD-10-CM | POA: Diagnosis not present

## 2024-02-08 DIAGNOSIS — Z1231 Encounter for screening mammogram for malignant neoplasm of breast: Secondary | ICD-10-CM | POA: Diagnosis not present
